# Patient Record
Sex: Male | Born: 1985 | Race: Black or African American | Hispanic: No | Marital: Single | Smoking: Former smoker
Health system: Southern US, Community
[De-identification: ages and names within clinical notes are randomized; demographics above are authoritative.]

## PROBLEM LIST (undated history)

## (undated) DIAGNOSIS — T7840XA Allergy, unspecified, initial encounter: Secondary | ICD-10-CM

## (undated) HISTORY — DX: Allergy, unspecified, initial encounter: T78.40XA

## (undated) HISTORY — PX: OTHER SURGICAL HISTORY: SHX169

---

## 2013-08-22 ENCOUNTER — Ambulatory Visit (INDEPENDENT_AMBULATORY_CARE_PROVIDER_SITE_OTHER): Payer: 59 | Admitting: Family Medicine

## 2013-08-22 VITALS — BP 116/74 | HR 71 | Temp 98.8°F | Resp 16 | Ht 75.0 in | Wt 177.2 lb

## 2013-08-22 DIAGNOSIS — R21 Rash and other nonspecific skin eruption: Secondary | ICD-10-CM

## 2013-08-22 DIAGNOSIS — J029 Acute pharyngitis, unspecified: Secondary | ICD-10-CM

## 2013-08-22 DIAGNOSIS — R059 Cough, unspecified: Secondary | ICD-10-CM

## 2013-08-22 DIAGNOSIS — R05 Cough: Secondary | ICD-10-CM

## 2013-08-22 DIAGNOSIS — J0110 Acute frontal sinusitis, unspecified: Secondary | ICD-10-CM

## 2013-08-22 MED ORDER — AMOXICILLIN-POT CLAVULANATE 875-125 MG PO TABS: 1.0000 | ORAL_TABLET | Freq: Two times a day (BID) | ORAL | Status: DC

## 2013-08-22 MED ORDER — BENZONATATE 100 MG PO CAPS: 200.0000 mg | ORAL_CAPSULE | Freq: Two times a day (BID) | ORAL | Status: DC | PRN

## 2013-08-22 MED ORDER — HYDROCODONE-HOMATROPINE 5-1.5 MG/5ML PO SYRP: 5.0000 mL | ORAL_SOLUTION | Freq: Every evening | ORAL | Status: DC | PRN

## 2013-08-22 NOTE — Progress Notes (Signed)
      Chief Complaint:  Chief Complaint  Patient presents with  . Sore Throat    x 2 days  . URI    x 2 days with fever and chills    HPI: Anthony Silva is a 28 y.o. male who is here for hot and cold chills since Saturday., associated with Sinus HA, has taken ibuprofen.  He has allergies. He also has boils on his let arm and  also left leg which were much bigger and painful and were draining .  He has been putting warm compresses on it. Denies any DM, HIV.   Past Medical History  Diagnosis Date  . Allergy    History reviewed. No pertinent past surgical history. History   Social History  . Marital Status: Single    Spouse Name: N/A    Number of Children: N/A  . Years of Education: N/A   Social History Main Topics  . Smoking status: Current Every Day Smoker  . Smokeless tobacco: None  . Alcohol Use: Yes  . Drug Use: Yes    Special: Marijuana  . Sexual Activity: None   Other Topics Concern  . None   Social History Narrative  . None   Family History  Problem Relation Age of Onset  . Diabetes Father   . Diabetes Sister    No Known Allergies Prior to Admission medications   Not on File     ROS: The patient denies fevers, chills, night sweats, unintentional weight loss, chest pain, palpitations, wheezing, dyspnea on exertion, nausea, vomiting, abdominal pain, dysuria, hematuria, melena, numbness, weakness, or tingling.   All other systems have been reviewed and were otherwise negative with the exception of those mentioned in the HPI and as above.    PHYSICAL EXAM: Filed Vitals:   08/22/13 1539  BP: 116/74  Pulse: 71  Temp: 98.8 F (37.1 C)  Resp: 16   Filed Vitals:   08/22/13 1539  Height: 6\' 3"  (1.905 m)  Weight: 177 lb 3.2 oz (80.377 kg)   Body mass index is 22.15 kg/(m^2).  General: Alert, no acute distress HEENT:  Normocephalic, atraumatic, oropharynx patent. EOMI, PERRLA, tm normal, + erythem throat, no exudates, + sinus  tenderness Cardiovascular:  Regular rate and rhythm, no rubs murmurs or gallops.  Radial pulse intact. No pedal edema.  Respiratory: Clear to auscultation bilaterally.  No wheezes, rales, or rhonchi.  No cyanosis, no use of accessory musculature GI: No organomegaly, abdomen is soft and non-tender, positive bowel sounds.  No masses. Skin: + abscess, no warmth or erythema, nonfluctuant Neurologic: Facial musculature symmetric. Psychiatric: Patient is appropriate throughout our interaction. Lymphatic: No cervical lymphadenopathy Musculoskeletal: Gait intact.   LABS: No results found for this or any previous visit.   EKG/XRAY:   [facility]   ASSESSMENT/PLAN: Encounter Diagnoses  Name Primary?  . Acute frontal sinusitis, recurrence not specified Yes  . Cough   . Acute pharyngitis, unspecified pharyngitis type   . Rash and nonspecific skin eruption    C/w warm compresses for abscesses, there is no fluctuance Rx augmentin, hycodan, tessalon perles F/u prn  Gross sideeffects, risk and benefits, and alternatives of medications d/w patient. Patient is aware that all medications have potential sideeffects and we are unable to predict every sideeffect or drug-drug interaction that may occur.  ,  PHUONG, DO 08/25/2013 5:56 AM

## 2015-03-06 ENCOUNTER — Ambulatory Visit (INDEPENDENT_AMBULATORY_CARE_PROVIDER_SITE_OTHER): Payer: BLUE CROSS/BLUE SHIELD | Admitting: Physician Assistant

## 2015-03-06 DIAGNOSIS — A084 Viral intestinal infection, unspecified: Secondary | ICD-10-CM | POA: Diagnosis not present

## 2015-03-06 NOTE — Progress Notes (Signed)
[facility] [address] [place] [address] [phone]  Date:  03/06/2015   Name:  Anthony Silva   DOB:  18-Jan-1985   MRN:  [id number]  PCP:  No primary care provider on file.   Chief Complaint  Patient presents with  . Chills    x 2 days  . Fever  . Nausea    History of Present Illness:  Anthony Silva is a 30 y.o. male patient who presents to [facility] for cc of fever chills, nausea and diarrhea.    Fever and chills that started 3 days ago--body aches in side and back.  He was having nausea and fatigue.  He was taking amoxicillin three times per day.  He has some coughing and gagging.  NO sore throat dor congestion.  He has been hydrating well.  Diarrhea 5-6 times per day, twice yesterday, and none today.  No frequency, hematuria, or dysuria.    There are no active problems to display for this patient.   Past Medical History  Diagnosis Date  . Allergy     Past Surgical History  Procedure Laterality Date  . Tooth removal      Social History  Substance Use Topics  . Smoking status: Current Every Day Smoker  . Smokeless tobacco: None  . Alcohol Use: Yes    Family History  Problem Relation Age of Onset  . Diabetes Father   . Diabetes Sister     No Known Allergies  Medication list has been reviewed and updated.  No current outpatient prescriptions on file prior to visit.   No current facility-administered medications on file prior to visit.    ROS ROS otherwise unremarkable unless listed above.   Physical Examination: There were no vitals taken for this visit. Ideal Body Weight:    Physical Exam  Constitutional: He is oriented to person, place, and time. He appears well-developed and well-nourished. No distress.  HENT:  Head: Normocephalic and atraumatic.  Right Ear: Tympanic membrane, external ear and ear canal normal.  Left Ear: Tympanic membrane, external ear and ear canal normal.  Nose: Mucosal edema and rhinorrhea  present. Right sinus exhibits no maxillary sinus tenderness and no frontal sinus tenderness. Left sinus exhibits no maxillary sinus tenderness and no frontal sinus tenderness.  Mouth/Throat: No uvula swelling. No oropharyngeal exudate, posterior oropharyngeal edema or posterior oropharyngeal erythema.  Eyes: Conjunctivae, EOM and lids are normal. Pupils are equal, round, and reactive to light. Right eye exhibits normal extraocular motion. Left eye exhibits normal extraocular motion.  Neck: Trachea normal and full passive range of motion without pain. No edema and no erythema present.  Cardiovascular: Normal rate.   Pulmonary/Chest: Effort normal. No respiratory distress. He has no decreased breath sounds. He has no wheezes. He has no rhonchi.  Abdominal: Soft. Normal appearance. Bowel sounds are increased. There is no tenderness.  Neurological: He is alert and oriented to person, place, and time.  Skin: Skin is warm and dry. He is not diaphoretic.  Psychiatric: He has a normal mood and affect. His behavior is normal.      Assessment and Plan: Anthony Silva is a 30 y.o. male who is here today for abdominal pain and diarrhea. This appears to be improving.  And likely viral (gastroenteritis, influenza).  Given anti-diarrhea diet.  Advised heavy hydration at this time.  He does not want further blood work.   Viral gastroenteritis     Trena Platt, PA-C [facility] 03/06/2015 1:30 PM

## 2015-03-06 NOTE — Patient Instructions (Addendum)
I would like you to hydrate well with water, and also mix a little gatorade and water to build your electrolytes.  64 oz of water if not more.   Please follow the diarrhea-friendly diet for the next 4-5 days.    Viral Gastroenteritis Viral gastroenteritis is also known as stomach flu. This condition affects the stomach and intestinal tract. It can cause sudden diarrhea and vomiting. The illness typically lasts 3 to 8 days. Most people develop an immune response that eventually gets rid of the virus. While this natural response develops, the virus can make you quite ill. CAUSES  Many different viruses can cause gastroenteritis, such as rotavirus or noroviruses. You can catch one of these viruses by consuming contaminated food or water. You may also catch a virus by sharing utensils or other personal items with an infected person or by touching a contaminated surface. SYMPTOMS  The most common symptoms are diarrhea and vomiting. These problems can cause a severe loss of body fluids (dehydration) and a body salt (electrolyte) imbalance. Other symptoms may include:  Fever.  Headache.  Fatigue.  Abdominal pain. DIAGNOSIS  Your caregiver can usually diagnose viral gastroenteritis based on your symptoms and a physical exam. A stool sample may also be taken to test for the presence of viruses or other infections. TREATMENT  This illness typically goes away on its own. Treatments are aimed at rehydration. The most serious cases of viral gastroenteritis involve vomiting so severely that you are not able to keep fluids down. In these cases, fluids must be given through an intravenous line (IV). HOME CARE INSTRUCTIONS   Drink enough fluids to keep your urine clear or pale yellow. Drink small amounts of fluids frequently and increase the amounts as tolerated.  Ask your caregiver for specific rehydration instructions.  Avoid:  Foods high in sugar.  Alcohol.  Carbonated  drinks.  Tobacco.  Juice.  Caffeine drinks.  Extremely hot or cold fluids.  Fatty, greasy foods.  Too much intake of anything at one time.  Dairy products until 24 to 48 hours after diarrhea stops.  You may consume probiotics. Probiotics are active cultures of beneficial bacteria. They may lessen the amount and number of diarrheal stools in adults. Probiotics can be found in yogurt with active cultures and in supplements.  Wash your hands well to avoid spreading the virus.  Only take over-the-counter or prescription medicines for pain, discomfort, or fever as directed by your caregiver. Do not give aspirin to children. Antidiarrheal medicines are not recommended.  Ask your caregiver if you should continue to take your regular prescribed and over-the-counter medicines.  Keep all follow-up appointments as directed by your caregiver. SEEK IMMEDIATE MEDICAL CARE IF:   You are unable to keep fluids down.  You do not urinate at least once every 6 to 8 hours.  You develop shortness of breath.  You notice blood in your stool or vomit. This may look like coffee grounds.  You have abdominal pain that increases or is concentrated in one small area (localized).  You have persistent vomiting or diarrhea.  You have a fever.  The patient is a child younger than 3 months, and he or she has a fever.  The patient is a child older than 3 months, and he or she has a fever and persistent symptoms.  The patient is a child older than 3 months, and he or she has a fever and symptoms suddenly get worse.  The patient is a baby, and he  or she has no tears when crying. MAKE SURE YOU:   Understand these instructions.  Will watch your condition.  Will get help right away if you are not doing well or get worse.   This information is not intended to replace advice given to you by your health care provider. Make sure you discuss any questions you have with your health care provider.    Document Released: 12/30/2004 Document Revised: 03/24/2011 Document Reviewed: 10/16/2010 Elsevier Interactive Patient Education 2016 [employer].  Food Choices to Help Relieve Diarrhea, Adult When you have diarrhea, the foods you eat and your eating habits are very important. Choosing the right foods and drinks can help relieve diarrhea. Also, because diarrhea can last up to 7 days, you need to replace lost fluids and electrolytes (such as sodium, potassium, and chloride) in order to help prevent dehydration.  WHAT GENERAL GUIDELINES DO I NEED TO FOLLOW?  Slowly drink 1 cup (8 oz) of fluid for each episode of diarrhea. If you are getting enough fluid, your urine will be clear or pale yellow.  Eat starchy foods. Some good choices include white rice, white toast, pasta, low-fiber cereal, baked potatoes (without the skin), saltine crackers, and bagels.  Avoid large servings of any cooked vegetables.  Limit fruit to two servings per day. A serving is  cup or 1 small piece.  Choose foods with less than 2 g of fiber per serving.  Limit fats to less than 8 tsp (38 g) per day.  Avoid fried foods.  Eat foods that have probiotics in them. Probiotics can be found in certain dairy products.  Avoid foods and beverages that may increase the speed at which food moves through the stomach and intestines (gastrointestinal tract). Things to avoid include:  High-fiber foods, such as dried fruit, raw fruits and vegetables, nuts, seeds, and whole grain foods.  Spicy foods and high-fat foods.  Foods and beverages sweetened with high-fructose corn syrup, honey, or sugar alcohols such as xylitol, sorbitol, and mannitol. WHAT FOODS ARE RECOMMENDED? Grains White rice. White, [place], or pita breads (fresh or toasted), including plain rolls, buns, or bagels. White pasta. Saltine, soda, or graham crackers. Pretzels. Low-fiber cereal. Cooked cereals made with water (such as cornmeal, farina, or cream cereals).  Plain muffins. Matzo. Melba toast. Zwieback.  Vegetables Potatoes (without the skin). Strained tomato and vegetable juices. Most well-cooked and canned vegetables without seeds. Tender lettuce. Fruits Cooked or canned applesauce, apricots, cherries, fruit cocktail, grapefruit, peaches, pears, or plums. Fresh bananas, apples without skin, cherries, grapes, cantaloupe, grapefruit, peaches, oranges, or plums.  Meat and Other Protein Products Baked or boiled chicken. Eggs. Tofu. Fish. Seafood. Smooth peanut butter. Ground or well-cooked tender beef, ham, veal, lamb, pork, or poultry.  Dairy Plain yogurt, kefir, and unsweetened liquid yogurt. Lactose-free milk, buttermilk, or soy milk. Plain hard cheese. Beverages Sport drinks. Clear broths. Diluted fruit juices (except prune). Regular, caffeine-free sodas such as ginger ale. Water. Decaffeinated teas. Oral rehydration solutions. Sugar-free beverages not sweetened with sugar alcohols. Other Bouillon, broth, or soups made from recommended foods.  The items listed above may not be a complete list of recommended foods or beverages. Contact your dietitian for more options. WHAT FOODS ARE NOT RECOMMENDED? Grains Whole grain, whole wheat, bran, or rye breads, rolls, pastas, crackers, and cereals. Wild or brown rice. Cereals that contain more than 2 g of fiber per serving. Corn tortillas or taco shells. Cooked or dry oatmeal. Granola. Popcorn. Vegetables Raw vegetables. Cabbage, broccoli, Brussels sprouts,  artichokes, baked beans, beet greens, corn, kale, legumes, peas, sweet potatoes, and yams. Potato skins. Cooked spinach and cabbage. Fruits Dried fruit, including raisins and dates. Raw fruits. Stewed or dried prunes. Fresh apples with skin, apricots, mangoes, pears, raspberries, and strawberries.  Meat and Other Protein Products Chunky peanut butter. Nuts and seeds. Beans and lentils. Tomasa Blase.  Dairy High-fat cheeses. Milk, chocolate milk, and beverages  made with milk, such as milk shakes. Cream. Ice cream. Sweets and Desserts Sweet rolls, doughnuts, and sweet breads. Pancakes and waffles. Fats and Oils Butter. Cream sauces. Margarine. Salad oils. Plain salad dressings. Olives. Avocados.  Beverages Caffeinated beverages (such as coffee, tea, soda, or energy drinks). Alcoholic beverages. Fruit juices with pulp. Prune juice. Soft drinks sweetened with high-fructose corn syrup or sugar alcohols. Other Coconut. Hot sauce. Chili powder. Mayonnaise. Gravy. Cream-based or milk-based soups.  The items listed above may not be a complete list of foods and beverages to avoid. Contact your dietitian for more information. WHAT SHOULD I DO IF I BECOME DEHYDRATED? Diarrhea can sometimes lead to dehydration. Signs of dehydration include dark urine and dry mouth and skin. If you think you are dehydrated, you should rehydrate with an oral rehydration solution. These solutions can be purchased at pharmacies, retail stores, or online.  Drink -1 cup (120-240 mL) of oral rehydration solution each time you have an episode of diarrhea. If drinking this amount makes your diarrhea worse, try drinking smaller amounts more often. For example, drink 1-3 tsp (5-15 mL) every 5-10 minutes.  A general rule for staying hydrated is to drink 1-2 L of fluid per day. Talk to your health care provider about the specific amount you should be drinking each day. Drink enough fluids to keep your urine clear or pale yellow.   This information is not intended to replace advice given to you by your health care provider. Make sure you discuss any questions you have with your health care provider.   Document Released: 03/22/2003 Document Revised: 01/20/2014 Document Reviewed: 11/22/2012 Elsevier Interactive Patient Education [employer]

## 2016-04-14 ENCOUNTER — Ambulatory Visit (INDEPENDENT_AMBULATORY_CARE_PROVIDER_SITE_OTHER): Payer: BLUE CROSS/BLUE SHIELD | Admitting: Urgent Care

## 2016-04-14 VITALS — BP 122/73 | HR 105 | Resp 14 | Ht 75.5 in | Wt 160.0 lb

## 2016-04-14 DIAGNOSIS — F1990 Other psychoactive substance use, unspecified, uncomplicated: Secondary | ICD-10-CM | POA: Diagnosis not present

## 2016-04-14 DIAGNOSIS — F411 Generalized anxiety disorder: Secondary | ICD-10-CM | POA: Diagnosis not present

## 2016-04-14 DIAGNOSIS — L299 Pruritus, unspecified: Secondary | ICD-10-CM

## 2016-04-14 MED ORDER — IVERMECTIN 3 MG PO TABS: 200.0000 ug/kg | ORAL_TABLET | ORAL | 0 refills | Status: DC

## 2016-04-14 MED ORDER — HYDROXYZINE HCL 50 MG PO TABS: 50.0000 mg | ORAL_TABLET | Freq: Every evening | ORAL | 0 refills | Status: DC | PRN

## 2016-04-14 NOTE — Patient Instructions (Addendum)
Ivermectin Take 5 tablets today. Repeat this dose in 10 days.  Anxiety Take Vistaril for your anxiety associated with the rash. Do not use marijuana or alcohol.     IF you received an x-ray today, you will receive an invoice from [facility]. Please contact [facility] at [phone] with questions or concerns regarding your invoice.   IF you received labwork today, you will receive an invoice from [facility]. Please contact [facility] at [phone] with questions or concerns regarding your invoice.   Our billing staff will not be able to assist you with questions regarding bills from these companies.  You will be contacted with the lab results as soon as they are available. The fastest way to get your results is to activate your My Chart account. Instructions are located on the last page of this paperwork. If you have not heard from [place] regarding the results in 2 weeks, please contact this office.

## 2016-04-14 NOTE — Progress Notes (Signed)
  MRN: [id number] DOB: 12/03/1985  Subjective:   Anthony Silva is a 31 y.o. male presenting for chief complaint of Personal Problem (? scabies. has used prometherine cream x2 doses states still has itching)  Reports 3 week history of worsening itching of his arms, now spreading to the rest of his body. He first noticed a bug bite over his torso and body. States that he visually saw them burrow into his arms. Has even tried to pick them out using tacs. Has been seen at an urgent care, started taking a permethrin. Used his brother's left over supply of amoxicillin. Has been smoking weed daily due anxiety from his symptoms, itching. Smokes 1 ppd. Has been using cocaine as well. Denis using crystal meth, narcotics, heroin. Denies fever, chest pain, confusion, rash.  Koen has a current medication list which includes the following prescription(s): amoxicillin and permethrin. Also has No Known Allergies. Laquentin  has a past medical history of Allergy. Also  has a past surgical history that includes tooth removal.   Objective:   Vitals: BP 122/73   Pulse (!) 105   Resp 14   Ht 6' 3.5" (1.918 m)   Wt 160 lb (72.6 kg)   SpO2 99%   BMI 19.73 kg/m   Physical Exam  Constitutional: He is oriented to person, place, and time. He appears well-developed and well-nourished.  HENT:  Mouth/Throat: Oropharynx is clear and moist.  Eyes: No scleral icterus.  Conjunctiva injected bilaterally.  Neck: Normal range of motion. Neck supple.  Cardiovascular: Exam reveals no gallop and no friction rub.   No murmur heard. Rate was 88 on recheck by PA-Henlee Donovan.  Pulmonary/Chest: No respiratory distress. He has no wheezes. He has no rales.  Lymphadenopathy:    He has no cervical adenopathy.  Neurological: He is alert and oriented to person, place, and time.  Skin: Skin is warm and dry. No rash noted.  Excoriation on left distal bicep.  Psychiatric:  Patient is very anxious, tearful.   Assessment and Plan :    1. Itching 2. Anxiety state 3. Drug use - Patient is very worried about scabies. I will have patient use ivermectin to address this given that it is low risk. Will start patient on Vistaril. Counseled on stopping his drug use, alcohol use. He contracted for safety. Recheck in 1 week to discuss anti-anxiety and depression medications, labs pending.  Wallis Bamberg, PA-C [facility] at [facility] [phone] 04/14/2016  10:58 AM

## 2016-04-15 LAB — CBC WITH DIFFERENTIAL/PLATELET
Basophils Absolute: 0 10*3/uL (ref 0.0–0.2)
Basos: 0 %
EOS (ABSOLUTE): 0.1 10*3/uL (ref 0.0–0.4)
Eos: 2 %
Hematocrit: 45.5 % (ref 37.5–51.0)
Hemoglobin: 15.5 g/dL (ref 13.0–17.7)
Immature Grans (Abs): 0 10*3/uL (ref 0.0–0.1)
Immature Granulocytes: 0 %
Lymphocytes Absolute: 1.7 10*3/uL (ref 0.7–3.1)
Lymphs: 29 %
MCH: 27.8 pg (ref 26.6–33.0)
MCHC: 34.1 g/dL (ref 31.5–35.7)
MCV: 82 fL (ref 79–97)
MONOCYTES: 10 %
Monocytes Absolute: 0.6 10*3/uL (ref 0.1–0.9)
Neutrophils Absolute: 3.5 10*3/uL (ref 1.4–7.0)
Neutrophils: 59 %
Platelets: 284 10*3/uL (ref 150–379)
RBC: 5.58 x10E6/uL (ref 4.14–5.80)
RDW: 13.7 % (ref 12.3–15.4)
WBC: 5.9 10*3/uL (ref 3.4–10.8)

## 2016-04-15 LAB — COMPREHENSIVE METABOLIC PANEL
A/G RATIO: 1.6 (ref 1.2–2.2)
ALK PHOS: 55 IU/L (ref 39–117)
ALT: 17 IU/L (ref 0–44)
AST: 21 IU/L (ref 0–40)
Albumin: 4.8 g/dL (ref 3.5–5.5)
BUN/Creatinine Ratio: 12 (ref 9–20)
BUN: 14 mg/dL (ref 6–20)
Bilirubin Total: 0.7 mg/dL (ref 0.0–1.2)
CO2: 20 mmol/L (ref 18–29)
Calcium: 9.8 mg/dL (ref 8.7–10.2)
Chloride: 95 mmol/L — ABNORMAL LOW (ref 96–106)
Creatinine, Ser: 1.15 mg/dL (ref 0.76–1.27)
GFR calc Af Amer: 100 mL/min/{1.73_m2} (ref >59)
GFR calc non Af Amer: 86 mL/min/{1.73_m2} (ref >59)
GLOBULIN, TOTAL: 3 g/dL (ref 1.5–4.5)
Glucose: 84 mg/dL (ref 65–99)
POTASSIUM: 4.2 mmol/L (ref 3.5–5.2)
SODIUM: 135 mmol/L (ref 134–144)
Total Protein: 7.8 g/dL (ref 6.0–8.5)

## 2016-04-15 LAB — SEDIMENTATION RATE: Sed Rate: 7 mm/hr (ref 0–15)

## 2016-04-17 ENCOUNTER — Telehealth: Payer: Self-pay | Admitting: Urgent Care

## 2016-04-17 NOTE — Telephone Encounter (Signed)
Note for work is okay - I let patient know that this does not qualify for disability or FMLA. He verbalized understanding. But give him the note if he wants it.

## 2016-04-17 NOTE — Telephone Encounter (Signed)
Ok to write? 

## 2016-04-17 NOTE — Telephone Encounter (Signed)
Pt states that he still not feeling well and will like to know if you can write him another note to return to work on Monday. He also states that he is very sore.  Please advise,  Pt contact number [phone]

## 2016-04-17 NOTE — Telephone Encounter (Signed)
Done and picked up. 

## 2016-04-29 ENCOUNTER — Ambulatory Visit (INDEPENDENT_AMBULATORY_CARE_PROVIDER_SITE_OTHER): Payer: BLUE CROSS/BLUE SHIELD | Admitting: Physician Assistant

## 2016-04-29 VITALS — BP 114/66 | HR 87 | Temp 98.5°F | Resp 18 | Ht 75.5 in | Wt 166.8 lb

## 2016-04-29 DIAGNOSIS — J309 Allergic rhinitis, unspecified: Secondary | ICD-10-CM

## 2016-04-29 DIAGNOSIS — H1013 Acute atopic conjunctivitis, bilateral: Secondary | ICD-10-CM | POA: Diagnosis not present

## 2016-04-29 MED ORDER — OLOPATADINE HCL 0.2 % OP SOLN: 1.0000 [drp] | Freq: Every day | OPHTHALMIC | 1 refills | Status: DC

## 2016-04-29 MED ORDER — FLUTICASONE PROPIONATE 50 MCG/ACT NA SUSP: 2.0000 | Freq: Every day | NASAL | 6 refills | Status: DC

## 2016-04-29 MED ORDER — CETIRIZINE HCL 10 MG PO CAPS: 1.0000 | ORAL_CAPSULE | Freq: Every day | ORAL | 1 refills | Status: DC

## 2016-04-29 NOTE — Progress Notes (Signed)
MRN: [id number] DOB: [birth year]  Subjective:   Anthony Silva is a 31 y.o. male presenting for chief complaint of Allergies (x weeks  runny nose, watery eyes, sore throat ) .  Reports 3 week history of worsening rhinorrhea, itchy watery eyes, red eyes and sore throat. Has tried zyrtec, claritin, and benedryl, each for about a week at a time but they have not given him full relief.  Denies fever, rhinorrhea, ear pain, chest pain, myalgia and cough, chills, fatigue, nausea, vomiting, abdominal pain and diarrhea.  Has extensive history of seasonal allergies, but takes medication sporadically. He has been evaluated by an allergist in middle school but eventually stopped going. No history of asthma.  Current smoker, smokes 3 cigarettes a day. Denies any other aggravating or relieving factors, no other questions or concerns.    Truxton has a current medication list which includes the following prescription(s): hydroxyzine, amoxicillin, and ivermectin. Also has No Known Allergies.  Draycen  has a past medical history of Allergy. Also  has a past surgical history that includes tooth removal.   Objective:   Vitals: BP 114/66   Pulse 87   Temp 98.5 F (36.9 C) (Oral)   Resp 18   Ht 6' 3.5" (1.918 m)   Wt 166 lb 12.8 oz (75.7 kg)   SpO2 97%   BMI 20.57 kg/m   Physical Exam  Constitutional: He is oriented to person, place, and time. He appears well-developed and well-nourished.  HENT:  Head: Normocephalic and atraumatic.  Right Ear: External ear and ear canal normal. Tympanic membrane is retracted.  Left Ear: External ear and ear canal normal. Tympanic membrane is retracted.  Nose: Mucosal edema (moderate, bilaterally ) and rhinorrhea present. Right sinus exhibits no maxillary sinus tenderness and no frontal sinus tenderness. Left sinus exhibits no maxillary sinus tenderness and no frontal sinus tenderness.  Mouth/Throat: Uvula is midline and mucous membranes are normal. Posterior  oropharyngeal erythema present. No tonsillar exudate.  Eyes: Right eye exhibits discharge (watery). Left eye exhibits discharge (watery). Right conjunctiva is injected. Left conjunctiva is injected.  Neck: Normal range of motion.  Cardiovascular: Normal rate, regular rhythm, normal heart sounds and normal pulses.   Pulmonary/Chest: Effort normal and breath sounds normal.  Lymphadenopathy:       Head (right side): No submental, no submandibular, no tonsillar, no preauricular, no posterior auricular and no occipital adenopathy present.       Head (left side): No submental, no submandibular, no tonsillar, no preauricular, no posterior auricular and no occipital adenopathy present.       Right: No supraclavicular adenopathy present.       Left: No supraclavicular adenopathy present.  Neurological: He is alert and oriented to person, place, and time.  Skin: Skin is warm and dry.  Psychiatric: He has a normal mood and affect.  Vitals reviewed.  No results found for this or any previous visit (from the past 24 hour(s)).  Assessment and Plan :  1. Allergic rhinitis, unspecified seasonality, unspecified trigger -Pt instructed to take medications daily for the next 3 weeks instead of sporadically. If no improvement in 3 weeks, contact our office as he may warrant from referral to allergy specialist at that time.  - fluticasone (FLONASE) 50 MCG/ACT nasal spray; Place 2 sprays into both nostrils daily.  Dispense: 16 g; Refill: 6 - Cetirizine HCl 10 MG CAPS; Take 1 capsule (10 mg total) by mouth daily.  Dispense: 90 capsule; Refill: 1  2. Allergic conjunctivitis of  both eyes - Olopatadine HCl 0.2 % SOLN; Apply 1 drop to eye daily.  Dispense: 2.5 mL; Refill: 1  Benjiman Core, PA-C  [facility] 04/29/2016 10:10 AM

## 2016-04-29 NOTE — Patient Instructions (Addendum)
I would like you start taking zyrtec, flonase, and antihistamine eye drops daily at least until your allergy season is over. If you are not having any full improvement in 3 weeks, please contact our office and I will place a referral to an allergy specialist. Thank you for letting me participate in your health and well being.  Allergic Rhinitis Allergic rhinitis is when the mucous membranes in the nose respond to allergens. Allergens are particles in the air that cause your body to have an allergic reaction. This causes you to release allergic antibodies. Through a chain of events, these eventually cause you to release histamine into the blood stream. Although meant to protect the body, it is this release of histamine that causes your discomfort, such as frequent sneezing, congestion, and an itchy, runny nose. What are the causes? Seasonal allergic rhinitis (hay fever) is caused by pollen allergens that may come from grasses, trees, and weeds. Year-round allergic rhinitis (perennial allergic rhinitis) is caused by allergens such as house dust mites, pet dander, and mold spores. What are the signs or symptoms?  Nasal stuffiness (congestion).  Itchy, runny nose with sneezing and tearing of the eyes. How is this diagnosed? Your health care provider can help you determine the allergen or allergens that trigger your symptoms. If you and your health care provider are unable to determine the allergen, skin or blood testing may be used. Your health care provider will diagnose your condition after taking your health history and performing a physical exam. Your health care provider may assess you for other related conditions, such as asthma, pink eye, or an ear infection. How is this treated? Allergic rhinitis does not have a cure, but it can be controlled by:  Medicines that block allergy symptoms. These may include allergy shots, nasal sprays, and oral antihistamines.  Avoiding the allergen. Hay fever may  often be treated with antihistamines in pill or nasal spray forms. Antihistamines block the effects of histamine. There are over-the-counter medicines that may help with nasal congestion and swelling around the eyes. Check with your health care provider before taking or giving this medicine. If avoiding the allergen or the medicine prescribed do not work, there are many new medicines your health care provider can prescribe. Stronger medicine may be used if initial measures are ineffective. Desensitizing injections can be used if medicine and avoidance does not work. Desensitization is when a patient is given ongoing shots until the body becomes less sensitive to the allergen. Make sure you follow up with your health care provider if problems continue. Follow these instructions at home: It is not possible to completely avoid allergens, but you can reduce your symptoms by taking steps to limit your exposure to them. It helps to know exactly what you are allergic to so that you can avoid your specific triggers. Contact a health care provider if:  You have a fever.  You develop a cough that does not stop easily (persistent).  You have shortness of breath.  You start wheezing.  Symptoms interfere with normal daily activities. This information is not intended to replace advice given to you by your health care provider. Make sure you discuss any questions you have with your health care provider. Document Released: 09/24/2000 Document Revised: 08/31/2015 Document Reviewed: 09/06/2012 Elsevier Interactive Patient Education  2017 [employer].    IF you received an x-ray today, you will receive an invoice from [facility]. Please contact [facility] at [phone] with questions or concerns regarding your invoice.  IF you received labwork today, you will receive an invoice from [place]. Please contact [facility] at [phone] with questions or concerns regarding your invoice.    Our billing staff will not be able to assist you with questions regarding bills from these companies.  You will be contacted with the lab results as soon as they are available. The fastest way to get your results is to activate your My Chart account. Instructions are located on the last page of this paperwork. If you have not heard from [place] regarding the results in 2 weeks, please contact this office.

## 2016-05-11 ENCOUNTER — Other Ambulatory Visit: Payer: Self-pay | Admitting: Urgent Care

## 2016-05-11 DIAGNOSIS — F411 Generalized anxiety disorder: Secondary | ICD-10-CM

## 2016-06-05 ENCOUNTER — Ambulatory Visit (INDEPENDENT_AMBULATORY_CARE_PROVIDER_SITE_OTHER): Payer: BLUE CROSS/BLUE SHIELD | Admitting: Internal Medicine

## 2016-06-05 ENCOUNTER — Encounter: Payer: Self-pay | Admitting: Internal Medicine

## 2016-06-05 VITALS — BP 128/80 | HR 81 | Temp 98.5°F | Resp 16 | Ht 74.75 in | Wt 159.4 lb

## 2016-06-05 DIAGNOSIS — F203 Undifferentiated schizophrenia: Secondary | ICD-10-CM | POA: Diagnosis not present

## 2016-06-05 DIAGNOSIS — Z23 Encounter for immunization: Secondary | ICD-10-CM

## 2016-06-05 DIAGNOSIS — F19159 Other psychoactive substance abuse with psychoactive substance-induced psychotic disorder, unspecified: Secondary | ICD-10-CM | POA: Diagnosis not present

## 2016-06-05 DIAGNOSIS — F14151 Cocaine abuse with cocaine-induced psychotic disorder with hallucinations: Secondary | ICD-10-CM | POA: Insufficient documentation

## 2016-06-05 MED ORDER — CARIPRAZINE HCL 1.5 & 3 MG PO CPPK: 1.0000 | ORAL_CAPSULE | Freq: Every day | ORAL | 0 refills | Status: DC

## 2016-06-05 NOTE — Patient Instructions (Signed)
Schizophrenia  Schizophrenia is a mental illness. It may cause disturbed or disorganized thinking, speech, or behavior. People with schizophrenia have problems functioning in one or more areas of life. People with schizophrenia are at increased risk for suicide, certain long-term (chronic) physical illnesses, and unhealthy behaviors, such as smoking and drug use.  People who have family members with schizophrenia are at higher risk of developing the illness. Schizophrenia affects men and women equally, but it usually appears at an earlier age (teenage or early adult years) in men.  What are the causes?  The cause of this condition is not known.  What increases the risk?  The following factors may make you more likely to develop this condition:   Having a family member who has schizophrenia. Some gene combinations may increase the risk, but there is no single gene that causes schizophrenia.   Impaired brain or neurotransmitter development or chemistry. Neurotransmitters are chemicals in the brain.    What are the signs or symptoms?  The earliest symptoms are often subtle and may go unnoticed until the illness becomes more severe (first-break psychosis). Symptoms of schizophrenia may be ongoing (continuous) or may come and go in severity. Episodes are often triggered by major life events, such as:   Family stress.   College.   Military service.   Marriage.   Pregnancy or childbirth.   Divorce.   Loss of a loved one.    Symptoms may include:   Seeing, hearing, or feeling things that do not exist (hallucinations).   Having false beliefs (delusions). Delusions often involve beliefs that you are being attacked, harassed, cheated, persecuted, or conspired against (persecutory delusions).   Speech that does not make sense to others or is hard to understand (incoherent).   Behavior that is odd, confused, unfocused, withdrawn, or disorganized.   Extremely overactive or underactive motor activity (catatonia).  Motor activity is any action that involves the muscles.   Bland or blunted emotions (flat affect).   Loss of will power (avolition).   Withdrawal from social contacts (social isolation).    Symptoms may affect the level of functioning in one or more major areas of life, such as work, school, relationships, or self-care.  How is this diagnosed?  Schizophrenia is diagnosed through an assessment by a mental health care provider.   Your mental health care provider may ask questions about:  ? Your thoughts, behavior, and mood.  ? Your ability to function in daily life.  ? Your medical history.  ? Any use of alcohol or drugs, including prescription medicines.   You may have blood tests and imaging exams.    How is this treated?  Schizophrenia is a chronic illness that is best controlled with continuous treatment rather than treatment only when symptoms occur. The following treatments are used to manage schizophrenia:   Medicine. This is the most effective and important form of treatment for schizophrenia. Antipsychotic medicines are usually prescribed to help manage schizophrenia. Other types of medicine may be added to relieve any symptoms that may occur despite the use of antipsychotic medicines.   Counseling or talk therapy. Individual, group, or family counseling may be helpful in providing education, support, and guidance. Many people also benefit from social skills and job skills (vocational) training.    A combination of medicine and counseling is best for managing the disorder over time. A procedure in which electricity is applied to the brain through the scalp (electroconvulsive therapy) may be used to treat catatonic schizophrenia or   schizophrenia in people who cannot take medicine or do not respond to medicine and counseling.  Follow these instructions at home:   Keep stress under control. Stress may trigger psychosis and make symptoms worse.   Try to get as much sleep as you can.   Avoid alcohol and  drugs. They can affect how medicine works and make symptoms worse.   Surround yourself with people who care about you and can help you manage your condition.   Take over-the-counter and prescription medicines only as told by your health care provider.   Keep all follow-up visits as told by your health care provider and counselor. This is important.  Contact a health care provider if:   You have a bad response to changes in medicines or to your treatment plan.   You have trouble falling sleep.   You have a low mood that will not go away.   You are using:  ? Drugs.  ? Too much caffeine.  ? Tobacco products.  ? Alcohol.  Get help right away if:   You feel out of control.   You or others notice warning signs of suicide such as:  ? Increased use of drugs or alcohol  ? Expressing feelings of not having a purpose in life, being trapped, guilty, anxious and agitated, or hopeless.  ? Withdrawing from friends and family.  ? Showing uncontrolled anger, recklessness, and dramatic mood changes.  ? Talking about suicide, discussing or searching for methods.  If you ever feel like you may hurt yourself or others, or have thoughts about taking your own life, get help right away. You can go to your nearest emergency department or call:   Your local emergency services (911 in the [place]   A suicide crisis helpline, such as the National Suicide Prevention Lifeline at 1-800-273-8255. This is open 24 hours a day.    Summary   Schizophrenia is a mental illness that causes disturbed or disorganized thinking, speech, or behavior.   Symptoms of schizophrenia may be ongoing or may come and go. They are often triggered by major life events.   Keep stress under control. Stress may trigger psychosis and make symptoms worse.   Avoid alcohol and drugs. They can affect how medicine works and make symptoms worse.   Get help right away if you feel out of control.  This information is not intended to replace advice given to you by  your health care provider. Make sure you discuss any questions you have with your health care provider.  Document Released: 12/28/1999 Document Revised: 10/12/2015 Document Reviewed: 10/12/2015  Elsevier Interactive Patient Education  2017 [employer]

## 2016-06-05 NOTE — Progress Notes (Signed)
Subjective:  Patient ID: Anthony Silva, male    DOB: [birth year]  Age: 31 y.o. MRN: [id number]  CC: Delusional  NEW TO ME  HPI Anthony MerlCameron T Silva presents for concerns about thought disorder and hallucinations/delusions. He admits to intermittent abuse of marijuana, cocaine, and alcohol. For the last few months he's been seeing things that he has been told that other people aren't seeing. He describes looking out the window of his house and seeing a pack of foxes eat a dear. He tells me he points this out to other members of the house and they do not see the same thing. He also tells me that he's had images of animals elevating his mattress. He's never been treated for this before. He tells me he sleeps about 8 or 9 hours a night but does complain of feeling depressed and anxious. He said no suicidal or homicidal ideations. He denies auditory hallucinations of a command him to take an action or do anything dangerous. He denies any sprees of manic or hypomanic behavior  History Anthony Silva has a past medical history of Allergy.   He has a past surgical history that includes tooth removal.   His family history includes Diabetes in his father and sister.He reports that he has been smoking.  He has never used smokeless tobacco. He reports that he drinks alcohol. He reports that he uses drugs, including Marijuana and Cocaine.  Outpatient Medications Prior to Visit  Medication Sig Dispense Refill  . Cetirizine HCl 10 MG CAPS Take 1 capsule (10 mg total) by mouth daily. 90 capsule 1  . fluticasone (FLONASE) 50 MCG/ACT nasal spray Place 2 sprays into both nostrils daily. 16 g 6  . hydrOXYzine (ATARAX/VISTARIL) 50 MG tablet Take 1 tablet (50 mg total) by mouth at bedtime as needed. *office visit needed* 30 tablet 0  . Olopatadine HCl 0.2 % SOLN Apply 1 drop to eye daily. 2.5 mL 1   No facility-administered medications prior to visit.     ROS Review of Systems  Constitutional: Negative.  Negative  for activity change, appetite change, diaphoresis, fatigue and unexpected weight change.  HENT: Negative.   Eyes: Negative for visual disturbance.  Respiratory: Negative for cough, chest tightness, shortness of breath and wheezing.   Cardiovascular: Negative for chest pain, palpitations and leg swelling.  Gastrointestinal: Negative for abdominal pain, constipation, diarrhea, nausea and vomiting.  Endocrine: Negative.   Genitourinary: Negative.  Negative for difficulty urinating, frequency, penile swelling, scrotal swelling, testicular pain and urgency.  Musculoskeletal: Negative.  Negative for back pain and myalgias.  Allergic/Immunologic: Negative.   Neurological: Negative.  Negative for dizziness, weakness, light-headedness and headaches.  Hematological: Negative.  Negative for adenopathy.  Psychiatric/Behavioral: Positive for dysphoric mood and hallucinations. Negative for agitation, behavioral problems, confusion, decreased concentration, self-injury, sleep disturbance and suicidal ideas. The patient is nervous/anxious. The patient is not hyperactive.     Objective:  BP 128/80 (BP Location: Left Arm, Patient Position: Sitting, Cuff Size: Normal)   Pulse 81   Temp 98.5 F (36.9 C) (Oral)   Resp 16   Ht 6' 2.75" (1.899 m)   Wt 159 lb 7 oz (72.3 kg)   SpO2 98%   BMI 20.06 kg/m   Physical Exam  Constitutional: He is oriented to person, place, and time.  Non-toxic appearance. He does not have a sickly appearance. He does not appear ill. No distress.  HENT:  Mouth/Throat: No oropharyngeal exudate.  Eyes: Conjunctivae are normal. Right eye exhibits no discharge.  Left eye exhibits no discharge. No scleral icterus.  Neck: Normal range of motion. Neck supple. No JVD present. No tracheal deviation present. No thyromegaly present.  Cardiovascular: Normal rate, regular rhythm, normal heart sounds and intact distal pulses.  Exam reveals no gallop and no friction rub.   No murmur  heard. Pulmonary/Chest: Effort normal and breath sounds normal. No stridor. No respiratory distress. He has no wheezes. He has no rales. He exhibits no tenderness.  Abdominal: Soft. Bowel sounds are normal. He exhibits no distension and no mass. There is no tenderness. There is no rebound and no guarding.  Musculoskeletal: Normal range of motion. He exhibits no edema, tenderness or deformity.  Lymphadenopathy:    He has no cervical adenopathy.  Neurological: He is oriented to person, place, and time.  Skin: Skin is warm and dry. No rash noted. He is not diaphoretic. No erythema. No pallor.  Psychiatric: Judgment normal. His mood appears anxious. His affect is not angry, not blunt, not labile and not inappropriate. His speech is not delayed, not tangential and not slurred. He is not agitated, not aggressive, not hyperactive, not slowed, not withdrawn and not actively hallucinating. Thought content is delusional. Thought content is not paranoid. Cognition and memory are normal. Cognition and memory are not impaired. He does not express impulsivity. He does not exhibit a depressed mood. He expresses no homicidal and no suicidal ideation. He expresses no suicidal plans and no homicidal plans. He is attentive.  Vitals reviewed.   Lab Results  Component Value Date   WBC 5.9 04/14/2016   HCT 45.5 04/14/2016   PLT 284 04/14/2016   GLUCOSE 84 04/14/2016   ALT 17 04/14/2016   AST 21 04/14/2016   NA 135 04/14/2016   K 4.2 04/14/2016   CL 95 (L) 04/14/2016   CREATININE 1.15 04/14/2016   BUN 14 04/14/2016   CO2 20 04/14/2016    Assessment & Plan:   Jusitn was seen today for delusional.  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 Will start for Vraylar at the 1.5 mg dose and will slowly increase the dose over time  as tolerated and depending on his response to it. -     Cariprazine HCl (VRAYLAR) 1.5 & 3 MG CPPK; Take 1 tablet by mouth daily.  Polysubstance dependence including opioid type drug with complication, episodic abuse (HCC)- I encouraged him to abstain from the use of any mood and mind altering medications or substances. He agrees to try to achieve this. I also advised him that I think it would be good for him to consider entering to a drug rehabilitation program but he is not willing to commit to that at this time.  Need for Tdap vaccination -     Tdap vaccine greater than or equal to 7yo IM   I am having Mr. Musgrave start on Cariprazine HCl. I am also having him maintain his Olopatadine HCl, fluticasone, Cetirizine HCl, and hydrOXYzine.  Meds ordered this encounter  Medications  . Cariprazine HCl (VRAYLAR) 1.5 & 3 MG CPPK    Sig: Take 1 tablet by mouth daily.    Dispense:  28 each    Refill:  0     Follow-up: Return in about 3 weeks (around 06/26/2016).  Sanda Linger, MD

## 2016-06-17 ENCOUNTER — Ambulatory Visit: Payer: BLUE CROSS/BLUE SHIELD | Admitting: Physician Assistant

## 2016-07-20 ENCOUNTER — Emergency Department (HOSPITAL_COMMUNITY)
Admission: EM | Admit: 2016-07-20 | Discharge: 2016-07-20 | Disposition: A | Discharge status: Home / Self Care | Payer: BLUE CROSS/BLUE SHIELD | Attending: Emergency Medicine | Admitting: Emergency Medicine

## 2016-07-20 ENCOUNTER — Encounter (HOSPITAL_COMMUNITY): Payer: Self-pay

## 2016-07-20 DIAGNOSIS — M79604 Pain in right leg: Secondary | ICD-10-CM | POA: Insufficient documentation

## 2016-07-20 DIAGNOSIS — M542 Cervicalgia: Secondary | ICD-10-CM | POA: Insufficient documentation

## 2016-07-20 DIAGNOSIS — M79605 Pain in left leg: Secondary | ICD-10-CM | POA: Insufficient documentation

## 2016-07-20 DIAGNOSIS — M79642 Pain in left hand: Secondary | ICD-10-CM | POA: Insufficient documentation

## 2016-07-20 NOTE — ED Provider Notes (Signed)
[facility]-EMERGENCY DEPT Provider Note   CSN: [id number] Arrival date & time: 07/20/16  [id number]  By signing my name below, I, Cynda AcresHailei Fulton, attest that this documentation has been prepared under the direction and in the presence of [place] Russo, PA-C Electronically Signed: Cynda AcresHailei Fulton, Scribe. 07/20/16. 9:33 AM.  History   Chief Complaint Chief Complaint  Patient presents with  . right neck pain/ skin irritation   HPI Comments: Anthony Silva is a 31 y.o. male with a history of polysubstance abuse and schizophrenia, who presents to the Emergency Department complaining of sudden-onset "fox bites" to the neck, which appeared this morning. Patient believes he was bit by foxes at home all over his body. Patient states upon waking up he had marks all over his body. Patient reports a painful bite to the right-sided neck. Patient was drinking last night while at home. Patient states he has been seeing foxes outside and in his fire place lately. According to chart review the patient was seen by PCP on May 24th, in which he stated that he was seeing "foxes eating a deer outside of his house", which his family did not see. He was started on cariprazine, which he admits that he is not taking due to making him feel "down". Patient denies any SI, HI, auditory hallucinations, or visual hallucinations. Patient does report a history of scabies, in which he states his symptoms are similar. Patient reports associated pain 2/t to reported bites to the bilateral legs and left wrist. No medications taken for his symptoms. Nothing improves or worsens his pain. No pain with neck movement, but he does have pain with palpation. Patient denies any cocaine use, but states he smokes marijuanna on a regular basis. Patient denies any fever, chills, itching, N/T to extremities, bowel or bladder incontinence.   The history is provided by the patient. No language interpreter was used.    Past Medical History:  Diagnosis Date  .  Allergy     Patient Active Problem List   Diagnosis Date Noted  . 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 tooth removal         Home Medications    Prior to Admission medications   Medication Sig Start Date End Date Taking? Authorizing Provider  Cariprazine HCl (VRAYLAR) 1.5 & 3 MG CPPK Take 1 tablet by mouth daily. 06/05/16   Etta GrandchildJones, Thomas L, MD  Cetirizine HCl 10 MG CAPS Take 1 capsule (10 mg total) by mouth daily. 04/29/16   Benjiman CoreWiseman, Brittany D, PA-C  fluticasone (FLONASE) 50 MCG/ACT nasal spray Place 2 sprays into both nostrils daily. 04/29/16   Benjiman CoreWiseman, Brittany D, PA-C  hydrOXYzine (ATARAX/VISTARIL) 50 MG tablet Take 1 tablet (50 mg total) by mouth at bedtime as needed. *office visit needed* 05/12/16   Magdalene RiverWiseman, Brittany D, PA-C  Olopatadine HCl 0.2 % SOLN Apply 1 drop to eye daily. 04/29/16   Magdalene RiverWiseman, Brittany D, PA-C    Family History Family History  Problem Relation Age of Onset  . Diabetes Father   . Diabetes Sister     Social History Social History  Substance Use Topics  . Smoking status: Current Every Day Smoker  . Smokeless tobacco: Never Used  . Alcohol use Yes     Allergies   Patient has no known allergies.   Review of Systems Review of Systems  Constitutional: Negative for chills and fever.  Gastrointestinal:       No bowel incontinence  Genitourinary: Negative for difficulty urinating.  Musculoskeletal: Positive for arthralgias (left wrist, bilateral legs) and neck pain.  Skin: Positive for rash (left wrist, bilateral legs).  Neurological: Negative for weakness and numbness.  Psychiatric/Behavioral: Positive for hallucinations. Negative for self-injury and suicidal ideas.     Physical Exam Updated Vital Signs BP 120/75 (BP Location: Left Arm)   Pulse 88   Temp 97.7 F (36.5 C)   Resp 17   Ht 6\' 4"   (1.93 m)   Wt 77.1 kg (170 lb)   SpO2 99%   BMI 20.69 kg/m   Physical Exam  Constitutional: He appears well-developed and well-nourished. No distress.  HENT:  Head: Normocephalic and atraumatic.  Eyes: Conjunctivae are normal.  Neck:  Right sternocleidomastoid muscle with minimal tenderness, no overlying wound, erythema, or ecchymosis. No spinal or paraspinal tenderness. Normal ROM of the neck.   Cardiovascular: Normal rate, regular rhythm, normal heart sounds and intact distal pulses.   Intact distal pulses.   Pulmonary/Chest: Effort normal and breath sounds normal.  Musculoskeletal: Normal range of motion. He exhibits tenderness. He exhibits no edema or deformity.  Left dorsal hand without gross deformity, ecchymosis, wound, or edema. Mild tenderness over the dorsal hand, normal ROM of the wrist and fingers. Bilateral anterior legs with excoriation. No erythema, edema, or gross deformity. Area is non tender. Normal ROM of lower extremities.   Neurological: He is alert.  5/5 strength in the bilateral upper and lower extremities. Normal sensation. Normal gait.   Psychiatric: He has a normal mood and affect. His speech is normal and behavior is normal. He expresses no homicidal and no suicidal ideation.  Nursing note and vitals reviewed.    ED Treatments / Results  DIAGNOSTIC STUDIES: Oxygen Saturation is 99% on RA, normal by my interpretation.    COORDINATION OF CARE: 9:31 AM Discussed treatment plan with pt at bedside and pt agreed to plan.    Labs (all labs ordered are listed, but only abnormal results are displayed) Labs Reviewed - No data to display  EKG  EKG Interpretation None       Radiology No results found.  Procedures Procedures (including critical care time)  Medications Ordered in ED Medications - No data to display   Initial Impression / Assessment and Plan / ED Course  I have reviewed the triage vital signs and the nursing notes.  Pertinent labs  & imaging results that were available during my care of the patient were reviewed by me and considered in my medical decision making (see chart for details).     Patient w pain to right neck, left wrist and b/l legs, 2/t reported fox bite. Per chart review, pt with hx of hallucinations. He has been noncompliant with antipsychotics. Pt does not exhibit SI or HI. He does not exhibit signs of danger to himself or others. He is not in distress. Provided pt with reassurance that there were no signs of infection of injury to underlying structures. Strongly encouraged pt follow up with his PCP for medication adjustment. Conservative therapy recommended and discussed. Patient will be dc home & is agreeable with above plan.  Patient discussed with Dr. Rubin Payor, who agrees with care plan.  Discussed results, findings, treatment and follow up. Patient advised of return precautions. Patient verbalized understanding and agreed with plan.  Final Clinical Impressions(s) / ED Diagnoses   Final diagnoses:  Neck pain  Hand pain, left  Leg pain, bilateral  New Prescriptions Discharge Medication List as of 07/20/2016  9:32 AM     I personally performed the services described in this documentation, which was scribed in my presence. The recorded information has been reviewed and is accurate.    Russo, [place] N, PA-C 07/20/16 1039    Russo, [place] N, PA-C 07/20/16 1039    Benjiman Core, MD 07/20/16 1252

## 2016-07-20 NOTE — Discharge Instructions (Signed)
Please schedule an appointment with your primary care provider for follow up on your symptoms. Return to the ER for new or concerning symptoms.

## 2016-07-20 NOTE — ED Triage Notes (Signed)
Patient complains of right sided neck pain and burning/discomfort to arms and legs, thinks he was bitten by something. No redness, no swelling, NAD

## 2016-07-20 NOTE — ED Notes (Addendum)
Pt c/o "being bitten all over". Asked "if it could be related to foxes.Peggye Form.I've been seeing foxes in the fire place and outside my house".Hx of thought disorder and substance abuse.

## 2016-07-29 ENCOUNTER — Emergency Department (HOSPITAL_COMMUNITY)
Admission: EM | Admit: 2016-07-29 | Discharge: 2016-07-29 | Disposition: A | Discharge status: Home / Self Care | Payer: BLUE CROSS/BLUE SHIELD | Attending: Emergency Medicine | Admitting: Emergency Medicine

## 2016-07-29 ENCOUNTER — Encounter (HOSPITAL_COMMUNITY): Payer: Self-pay | Admitting: Emergency Medicine

## 2016-07-29 DIAGNOSIS — Z79899 Other long term (current) drug therapy: Secondary | ICD-10-CM | POA: Insufficient documentation

## 2016-07-29 DIAGNOSIS — F17210 Nicotine dependence, cigarettes, uncomplicated: Secondary | ICD-10-CM | POA: Insufficient documentation

## 2016-07-29 DIAGNOSIS — Z9114 Patient's other noncompliance with medication regimen: Secondary | ICD-10-CM | POA: Insufficient documentation

## 2016-07-29 DIAGNOSIS — F14151 Cocaine abuse with cocaine-induced psychotic disorder with hallucinations: Secondary | ICD-10-CM | POA: Diagnosis present

## 2016-07-29 DIAGNOSIS — R440 Auditory hallucinations: Secondary | ICD-10-CM

## 2016-07-29 DIAGNOSIS — Z046 Encounter for general psychiatric examination, requested by authority: Secondary | ICD-10-CM | POA: Insufficient documentation

## 2016-07-29 DIAGNOSIS — R441 Visual hallucinations: Secondary | ICD-10-CM | POA: Insufficient documentation

## 2016-07-29 DIAGNOSIS — F22 Delusional disorders: Secondary | ICD-10-CM

## 2016-07-29 LAB — CBC WITH DIFFERENTIAL/PLATELET
BASOS ABS: 0 10*3/uL (ref 0.0–0.1)
Basophils Relative: 1 %
Eosinophils Absolute: 0 10*3/uL (ref 0.0–0.7)
Eosinophils Relative: 1 %
HEMATOCRIT: 43.9 % (ref 39.0–52.0)
HEMOGLOBIN: 14.7 g/dL (ref 13.0–17.0)
LYMPHS PCT: 28 %
Lymphs Abs: 1.6 10*3/uL (ref 0.7–4.0)
MCH: 28.3 pg (ref 26.0–34.0)
MCHC: 33.5 g/dL (ref 30.0–36.0)
MCV: 84.6 fL (ref 78.0–100.0)
MONO ABS: 0.6 10*3/uL (ref 0.1–1.0)
Monocytes Relative: 11 %
NEUTROS ABS: 3.5 10*3/uL (ref 1.7–7.7)
Neutrophils Relative %: 61 %
Platelets: 234 10*3/uL (ref 150–400)
RBC: 5.19 MIL/uL (ref 4.22–5.81)
RDW: 13.1 % (ref 11.5–15.5)
WBC: 5.8 10*3/uL (ref 4.0–10.5)

## 2016-07-29 LAB — COMPREHENSIVE METABOLIC PANEL
ALBUMIN: 4.7 g/dL (ref 3.5–5.0)
ALK PHOS: 43 U/L (ref 38–126)
ALT: 20 U/L (ref 17–63)
ANION GAP: 11 (ref 5–15)
AST: 31 U/L (ref 15–41)
BILIRUBIN TOTAL: 0.8 mg/dL (ref 0.3–1.2)
BUN: 14 mg/dL (ref 6–20)
CALCIUM: 9.5 mg/dL (ref 8.9–10.3)
CO2: 25 mmol/L (ref 22–32)
Chloride: 104 mmol/L (ref 101–111)
Creatinine, Ser: 1.22 mg/dL (ref 0.61–1.24)
GFR calc non Af Amer: >60 mL/min (ref >60)
GLUCOSE: 118 mg/dL — AB (ref 65–99)
Potassium: 3.5 mmol/L (ref 3.5–5.1)
Sodium: 140 mmol/L (ref 135–145)
TOTAL PROTEIN: 8 g/dL (ref 6.5–8.1)

## 2016-07-29 LAB — RAPID URINE DRUG SCREEN, HOSP PERFORMED
Amphetamines: NOT DETECTED
BARBITURATES: NOT DETECTED
Benzodiazepines: NOT DETECTED
COCAINE: POSITIVE — AB
Opiates: NOT DETECTED
Tetrahydrocannabinol: POSITIVE — AB

## 2016-07-29 LAB — ETHANOL: Alcohol, Ethyl (B): <5 mg/dL (ref <5)

## 2016-07-29 MED ORDER — IBUPROFEN 200 MG PO TABS: 600.0000 mg | ORAL_TABLET | Freq: Three times a day (TID) | ORAL | Status: DC | PRN

## 2016-07-29 MED ORDER — NICOTINE 21 MG/24HR TD PT24
21.0000 mg | MEDICATED_PATCH | Freq: Every day | TRANSDERMAL | Status: DC
  Administered 2016-07-29: 21 mg via TRANSDERMAL
  Filled 2016-07-29: qty 1

## 2016-07-29 MED ORDER — ZOLPIDEM TARTRATE 5 MG PO TABS: 5.0000 mg | ORAL_TABLET | Freq: Every evening | ORAL | Status: DC | PRN

## 2016-07-29 MED ORDER — ONDANSETRON HCL 4 MG PO TABS: 4.0000 mg | ORAL_TABLET | Freq: Three times a day (TID) | ORAL | Status: DC | PRN

## 2016-07-29 MED ORDER — ALUM & MAG HYDROXIDE-SIMETH 200-200-20 MG/5ML PO SUSP: 30.0000 mL | Freq: Four times a day (QID) | ORAL | Status: DC | PRN

## 2016-07-29 MED ORDER — LORAZEPAM 1 MG PO TABS
1.0000 mg | ORAL_TABLET | Freq: Once | ORAL | Status: AC
  Administered 2016-07-29: 1 mg via ORAL
  Filled 2016-07-29: qty 1

## 2016-07-29 NOTE — ED Notes (Signed)
Pt discharged ambulatory with discharge instructions.  Pt was in no distress. He was calm and cooperative.  All belongings were returned to pt.

## 2016-07-29 NOTE — ED Notes (Signed)
Pt sts that he sees "many shadows" and hears people "racking" their guns; he sts that he cant sleep b/c people are watching him outside his house.  Mother is at bedside.  Mom sts that patient was started on a new medication that makes him very sleepy x3 days, so he stopped taking the medication about a week ago.  Pt has hx of schizophrenia

## 2016-07-29 NOTE — ED Triage Notes (Signed)
Pt reports seeing people come after him. Pt mother states that he has been having an episode that people are out to get him. Pt reports feeling paranoid. Pt denies SI/HI

## 2016-07-29 NOTE — ED Provider Notes (Signed)
[facility]-EMERGENCY DEPT Provider Note   CSN: [id number] Arrival date & time: 07/29/16  0012  By signing my name below, I, Ny'Kea Lewis, attest that this documentation has been prepared under the direction and in the presence of Dione Booze, MD. Electronically Signed: Karren Cobble, ED Scribe. 07/29/16. 2:56 AM.  History   Chief Complaint Chief Complaint  Patient presents with  . Hallucinations   The history is provided by the patient and a parent. No language interpreter was used.   HPI Comments: Anthony Silva is a 31 y.o. male with no pertinent history, who presents to the Emergency Department complaining of intermittent, gradually worsening, acute on chronic audible and visual hallucinations for the past few weeks. Pt reports he was in his room when he started to see "shadows of lights flashing into his room" that followed him around his house. He also reports hearing voices that want to cause him harm but without a plan. Per pt's mother, pt has been having intermittent episode of paranoia that past few weeks. Pt has not been taking his prescribed medication due to it "wiping him out". He endorses occasional marijuana, Ecstasy, and alcohol usage. Pt reports smokes two packs of cigarette a day. Denies homicidal or suicidal ideations, abdominal pain, fever or chills.   Past Medical History:  Diagnosis Date  . Allergy    Patient Active Problem List   Diagnosis Date Noted  . 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 tooth removal      Home Medications    Prior to Admission medications   Medication Sig Start Date End Date Taking? Authorizing Provider  aspirin-acetaminophen-caffeine (EXCEDRIN MIGRAINE) [phone] MG tablet Take 2 tablets by mouth every 6 (six) hours as needed for headache or migraine.   Yes [provider]  Cariprazine  HCl (VRAYLAR) 1.5 & 3 MG CPPK Take 1 tablet by mouth daily. 06/05/16   Etta Grandchild, MD  Cetirizine HCl 10 MG CAPS Take 1 capsule (10 mg total) by mouth daily. Patient not taking: Reported on 07/29/2016 04/29/16   Benjiman Core D, PA-C  fluticasone [facility]) 50 MCG/ACT nasal spray Place 2 sprays into both nostrils daily. Patient not taking: Reported on 07/29/2016 04/29/16   Benjiman Core D, PA-C  hydrOXYzine (ATARAX/VISTARIL) 50 MG tablet Take 1 tablet (50 mg total) by mouth at bedtime as needed. *office visit needed* Patient not taking: Reported on 07/29/2016 05/12/16   Benjiman Core D, PA-C  Olopatadine HCl 0.2 % SOLN Apply 1 drop to eye daily. Patient not taking: Reported on 07/29/2016 04/29/16   Magdalene River, PA-C   Family History Family History  Problem Relation Age of Onset  . Diabetes Father   . Diabetes Sister    Social History Social History  Substance Use Topics  . Smoking status: Current Every Day Smoker  . Smokeless tobacco: Never Used  . Alcohol use Yes   Allergies   Patient has no known allergies.  Review of Systems Review of Systems  Constitutional: Negative for chills and fever.  Gastrointestinal: Negative for abdominal pain.  Psychiatric/Behavioral: Positive for hallucinations. Negative for suicidal ideas.  All other systems reviewed and are negative.  Physical Exam Updated Vital Signs BP (!) 162/97 (BP Location: Left Arm)   Pulse 77   Temp 99.9 F (37.7 C) (Oral)   Resp (!) 21   Ht 6\' 4"  (1.93 m)   Wt  170 lb (77.1 kg)   SpO2 100%   BMI 20.69 kg/m   Physical Exam  Constitutional: He is oriented to person, place, and time. He appears well-developed and well-nourished.  HENT:  Head: Normocephalic and atraumatic.  Eyes: Pupils are equal, round, and reactive to light. EOM are normal.  Neck: Normal range of motion. Neck supple. No JVD present.  Cardiovascular: Normal rate, regular rhythm and normal heart sounds.   No murmur  heard. Pulmonary/Chest: Effort normal and breath sounds normal. He has no wheezes. He has no rales. He exhibits no tenderness.  Abdominal: Soft. Bowel sounds are normal. He exhibits no distension and no mass. There is no tenderness.  Musculoskeletal: Normal range of motion. He exhibits no edema.  Lymphadenopathy:    He has no cervical adenopathy.  Neurological: He is alert and oriented to person, place, and time. No cranial nerve deficit. He exhibits normal muscle tone. Coordination normal.  Skin: Skin is warm and dry. No rash noted.  Psychiatric:  Somewhat animated and expressing paranoid ideation.   Nursing note and vitals reviewed.   ED Treatments / Results  DIAGNOSTIC STUDIES: Oxygen Saturation is 100% on RA, normal by my interpretation.   COORDINATION OF CARE: 2:44 AM-Discussed next steps with pt. Pt verbalized understanding and is agreeable with the plan.   Labs (all labs ordered are listed, but only abnormal results are displayed) Labs Reviewed  COMPREHENSIVE METABOLIC PANEL - Abnormal; Notable for the following:       Result Value   Glucose, Bld 118 (*)    All other components within normal limits  ETHANOL  CBC WITH DIFFERENTIAL/PLATELET  RAPID URINE DRUG SCREEN, HOSP PERFORMED    Procedures Procedures (including critical care time)  Medications Ordered in ED Medications  LORazepam (ATIVAN) tablet 1 mg (not administered)   Initial Impression / Assessment and Plan / ED Course  I have reviewed the triage vital signs and the nursing notes.  Pertinent lab results that were available during my care of the patient were reviewed by me and considered in my medical decision making (see chart for details).  Paranoid ideation with both visual and auditory hallucinations. Overall picture is consistent with paranoid schizophrenia. Old records are reviewed, and he has no relevant past visits. He is being held in the ED for psychiatric evaluation.  Final Clinical  Impressions(s) / ED Diagnoses   Final diagnoses:  Paranoid ideation (HCC)  Visual hallucination  Auditory hallucinations    New Prescriptions New Prescriptions   No medications on file   I personally performed the services described in this documentation, which was scribed in my presence. The recorded information has been reviewed and is accurate.       Dione BoozeGlick, Mariaclara Spear, MD 07/29/16 [phone]

## 2016-07-29 NOTE — [facility] Suicide Risk Assessment (Signed)
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 rate 20, height 6\' 4"  (1.93 m), weight 77.1 kg (170 lb), SpO2 100 %.Body mass index is 20.69 kg/m.  General Appearance: Casual  Eye Contact::  Good  Speech:  Normal Rate409  Volume:  Normal  Mood:  Euthymic  Affect:  Congruent  Thought Process:  Coherent and Descriptions of Associations: Intact  Orientation:  Full (Time, Place, and Person)  Thought Content:  WDL and Logical  Suicidal Thoughts:  No  Homicidal Thoughts:  No  Memory:  Immediate;   Good Recent;   Good Remote;   Good  Judgement:  Fair  Insight:  Fair  Psychomotor Activity:  Normal  Concentration:  Good  Recall:  Fair  Fund of Knowledge:Fair  Language: Good  Akathisia:  No  Handed:  Right  AIMS (if indicated):     Assets:  Housing Leisure Time Physical Health Resilience Social Support  Sleep:     Cognition: WNL  ADL's:  Intact   Mental Status Per Nursing Assessment::   On Admission:   Cocaine abuse with psychosis.  Today, he is clear and coherent with no psychosis.  Educated about the use of cocaine and psychosis.  Starting a new job this week, reminded him of drug testing.  No suicidal/homicidal ideations or withdrawal symptoms.  Stable for discharge.  Demographic Factors:  Male and Adolescent or young adult  Loss Factors: NA  Historical  Factors: NA  Risk Reduction Factors:   Sense of responsibility to family, Living with another person, especially a relative and Positive social support  Continued Clinical Symptoms:  None  Cognitive Features That Contribute To Risk:  None    Suicide Risk:  Minimal: No identifiable suicidal ideation.  Patients presenting with no risk factors but with morbid ruminations; may be classified as minimal risk based on the severity of the depressive symptoms    Plan Of Care/Follow-up recommendations:  Activity:  as tolerated Diet:  heart healthy diet  LORD, JAMISON, NP 07/29/2016, 9:55 AM

## 2016-07-29 NOTE — ED Notes (Signed)
ED Provider at bedside. Dr. Preston Fleetingglick

## 2016-07-29 NOTE — ED Notes (Signed)
Family at bedside. mother 

## 2016-07-29 NOTE — BH Assessment (Addendum)
Tele Assessment Note   Anthony Silva is an 31 y.o. male.  -Clinician reviewed note by Dr. Preston Fleeting.  Anthony Silva is a 31 y.o. male with no pertinent history, who presents to the Emergency Department complaining of intermittent, gradually worsening, acute on chronic audible and visual hallucinations for the past few weeks. Pt reports he was in his room when he started to see "shadows of lights flashing into his room" that followed him around his house. He also reports hearing voices that want to cause him harm but without a plan. Per pt's mother, pt has been having intermittent episode of paranoia that past few weeks.  Mother said that patient believed that people were following them on the way to the hospital, that people were talking about him and conspiring against him.  When at home he was going from window to window, convinced people were outside trying to get to him.  He was hearing voices of people outside talking about him.  Patient has been seeing foxes in the house, coming in and out of the chimney.  He feels like he has been having something sniffing him, can feel breathing on his hands and arms.  Patient has a hard time giving a clear answer to questions.  He admits to feeling paranoid.  He will stay in bed for a long time.  He has been having mood swings between depression and mania.    Patient says he drinks about one beer every few days.  He has been using marijuana daily.  Patient is positive for cocaine but denies use.  He said that he has used ecstasy in the last few weeks.  Patient has no previous inpatient or outpatient care experience.  Patient's medication is prescribed by family doctor.  Patient wants to get help.  -Clinician discussed patient care with Donell Sievert, PA who recommends inpatient psychiatric care.  TTS to seek placement.  Diagnosis: Bipolar d/o w/ psychotic features; Cannabis use d/o severe  Past Medical History:  Past Medical History:  Diagnosis Date   . Allergy     Past Surgical History:  Procedure Laterality Date  . tooth removal      Family History:  Family History  Problem Relation Age of Onset  . Diabetes Father   . Diabetes Sister     Social History:  reports that he has been smoking.  He has never used smokeless tobacco. He reports that he drinks alcohol. He reports that he uses drugs, including Marijuana and Cocaine.  Additional Social History:  Alcohol / Drug Use Pain Medications: None Prescriptions: Hydroxizine (needs to be refilled); Varylor Over the Counter: Advil or allergy meds on a as needed basis.   History of alcohol / drug use?: Yes Substance #1 Name of Substance 1: Marijuana 1 - Age of First Use: 31 years of age 31 - Amount (size/oz): One blunt in a day 1 - Frequency: 2-3 days out of the week 1 - Duration: off and on 1 - Last Use / Amount: 07/15 Substance #2 Name of Substance 2: Cocaine 2 - Age of First Use: Around 31 years of age 8 - Amount (size/oz): Varies 2 - Frequency: Varies 2 - Duration: says it is off and on 2 - Last Use / Amount: Pt says he does not use but it is in UDS  CIWA: CIWA-Ar BP: 133/74 Pulse Rate: 77 COWS:    PATIENT STRENGTHS: (choose at least two) Average or above average intelligence Communication skills Supportive family/friends  Allergies:  No Known Allergies  Home Medications:  (Not in a hospital admission)  OB/GYN Status:  No LMP for male patient.  General Assessment Data Location of Assessment: [facility] TTS Assessment: In system Is this a Tele or Face-to-Face Assessment?: Face-to-Face Is this an Initial Assessment or a Re-assessment for this encounter?: Initial Assessment Marital status: Single Is patient pregnant?: No Pregnancy Status: No Living Arrangements: Parent (Living with mother and father.) Can pt return to current living arrangement?: Yes Admission Status: Voluntary Is patient capable of signing voluntary admission?: Yes Referral Source:  Self/Family/Friend Insurance type: None     Crisis Care Plan Living Arrangements: Parent (Living with mother and father.) Name of Psychiatrist: None Name of Therapist: None  Education Status Is patient currently in school?: No Highest grade of school patient has completed: 3 years at [employer]  Risk to self with the past 6 months Suicidal Ideation: No Has patient been a risk to self within the past 6 months prior to admission? : No Suicidal Intent: No Has patient had any suicidal intent within the past 6 months prior to admission? : No Is patient at risk for suicide?: No Suicidal Plan?: No Has patient had any suicidal plan within the past 6 months prior to admission? : No Access to Means: No What has been your use of drugs/alcohol within the last 12 months?: Cocaine, THC Previous Attempts/Gestures: No How many times?: 0 Other Self Harm Risks: None Triggers for Past Attempts: None known Intentional Self Injurious Behavior: None Family Suicide History: No Recent stressful life event(s): Other (Comment) (Stress "in [place].") Persecutory voices/beliefs?: Yes Depression: Yes Depression Symptoms: Despondent, Isolating, Loss of interest in usual pleasures, Insomnia Substance abuse history and/or treatment for substance abuse?: Yes Suicide prevention information given to non-admitted patients: Not applicable  Risk to Others within the past 6 months Homicidal Ideation: No Does patient have any lifetime risk of violence toward others beyond the six months prior to admission? : No Thoughts of Harm to Others: No Current Homicidal Intent: No Current Homicidal Plan: No Access to Homicidal Means: No Identified Victim: No one History of harm to others?: No Assessment of Violence: None Noted Violent Behavior Description: None reported Does patient have access to weapons?: No Criminal Charges Pending?: No Does patient have a court date: No Is patient on probation?:  No  Psychosis Hallucinations: Auditory, Visual (Hearing a voice; things moving in closet) Delusions: Grandiose  Mental Status Report Appearance/Hygiene: Unremarkable, In scrubs Eye Contact: Good Motor Activity: Freedom of movement, Unremarkable Speech: Logical/coherent Level of Consciousness: Alert Mood: Anxious, Apprehensive Affect: Apprehensive, Anxious Anxiety Level: Panic Attacks Panic attack frequency: Situational Most recent panic attack: Tonight Thought Processes: Tangential Judgement: Impaired Orientation: Person, Place, Time, Situation Obsessive Compulsive Thoughts/Behaviors: None  Cognitive Functioning Concentration: Decreased Memory: Remote Intact, Recent Intact IQ: Average Insight: Poor Impulse Control: Poor Appetite: Good Weight Loss: 0 Weight Gain: 0 Sleep: Decreased Total Hours of Sleep:  (<6H/D) Vegetative Symptoms: Staying in bed, Decreased grooming  ADLScreening [facility] Assessment Services) Patient's cognitive ability adequate to safely complete daily activities?: Yes Patient able to express need for assistance with ADLs?: Yes Independently performs ADLs?: Yes (appropriate for developmental age)  Prior Inpatient Therapy Prior Inpatient Therapy: No Prior Therapy Dates: None Prior Therapy Facilty/Provider(s): None Reason for Treatment: None  Prior Outpatient Therapy Prior Outpatient Therapy: No Prior Therapy Dates: N/A Prior Therapy Facilty/Provider(s): N/A Reason for Treatment: N/A Does patient have an ACCT team?: No Does patient have Intensive In-House Services?  : No Does patient have  [facility] services? : No Does patient have P4CC services?: No  ADL Screening (condition at time of admission) Patient's cognitive ability adequate to safely complete daily activities?: Yes Is the patient deaf or have difficulty hearing?: No Does the patient have difficulty seeing, even when wearing glasses/contacts?: No Does the patient have difficulty  concentrating, remembering, or making decisions?: No Patient able to express need for assistance with ADLs?: Yes Does the patient have difficulty dressing or bathing?: No Independently performs ADLs?: Yes (appropriate for developmental age) Does the patient have difficulty walking or climbing stairs?: No Weakness of Legs: None Weakness of Arms/Hands: None       Abuse/Neglect Assessment (Assessment to be complete while patient is alone) Physical Abuse: Denies Verbal Abuse: Denies Sexual Abuse: Denies Exploitation of patient/patient's resources: Denies Self-Neglect: Denies     Merchant navy officerAdvance Directives (For Healthcare) Does Patient Have a Medical Advance Directive?: No Would patient like information on creating a medical advance directive?: No - Patient declined    Additional Information 1:1 In Past 12 Months?: No CIRT Risk: No Elopement Risk: No Does patient have medical clearance?: Yes     Disposition:  Disposition Initial Assessment Completed for this Encounter: Yes Disposition of Patient: Inpatient treatment program, Referred to Type of inpatient treatment program: Adult Patient referred to: Other (Comment) (Pt to be reviewed by PA)  Beatriz StallionHarvey, Lydell Moga Ray 07/29/2016 7:00 AM

## 2016-07-29 NOTE — BH Assessment (Signed)
[facility] Assessment Progress Note  Per Thedore MinsMojeed Akintayo, MD, this pt does not require psychiatric hospitalization at this time.  [facility].  This has been included in pt's discharge instructions.  Pt's nurse, Kendal Hymendie, has been notified.  Doylene Canninghomas Jamerius Boeckman, MA Triage Specialist [phone]

## 2016-07-29 NOTE — ED Notes (Signed)
Urine specimen requested 

## 2016-07-29 NOTE — ED Notes (Signed)
Pt oriented to room and unit.  Pt arrived pleasant and cooperative.  Pt checked for contraband  Without incident.  No contraband found.  15 minute checks and video monitoring in place.

## 2016-07-29 NOTE — Discharge Instructions (Signed)
For your behavioral health needs, you are advised to follow up with [facility].  Contact them at your earliest opportunity to ask about scheduling an intake appointment: ° °     [facility] °     [address] °     [place], [place] [address] °     [phone] °

## 2017-05-06 ENCOUNTER — Other Ambulatory Visit: Payer: Self-pay | Admitting: Physician Assistant

## 2017-05-06 DIAGNOSIS — J309 Allergic rhinitis, unspecified: Secondary | ICD-10-CM

## 2017-05-06 NOTE — Telephone Encounter (Signed)
Rx refill from historical provider  Cetirizine 10 mg   LOV: 06/05/16  PCP: Yetta BarreJones  Pharmacy: verified

## 2017-05-06 NOTE — Telephone Encounter (Signed)
Pt was to follow up with PCP in June 2018. Can we have pt schedule an appt?

## 2017-05-07 ENCOUNTER — Other Ambulatory Visit: Payer: Self-pay | Admitting: Physician Assistant

## 2017-05-07 DIAGNOSIS — H1013 Acute atopic conjunctivitis, bilateral: Secondary | ICD-10-CM

## 2017-05-07 NOTE — Telephone Encounter (Signed)
Left message for patient to call back to schedule a follow up appointment with Dr Yetta BarreJones.

## 2017-10-11 ENCOUNTER — Encounter (HOSPITAL_COMMUNITY): Payer: Self-pay | Admitting: Urgent Care

## 2017-10-11 ENCOUNTER — Ambulatory Visit (HOSPITAL_COMMUNITY)
Admission: EM | Admit: 2017-10-11 | Discharge: 2017-10-11 | Disposition: A | Discharge status: Home / Self Care | Payer: BLUE CROSS/BLUE SHIELD | Attending: Urgent Care | Admitting: Urgent Care

## 2017-10-11 ENCOUNTER — Other Ambulatory Visit: Payer: Self-pay

## 2017-10-11 DIAGNOSIS — R6889 Other general symptoms and signs: Secondary | ICD-10-CM

## 2017-10-11 DIAGNOSIS — R0981 Nasal congestion: Secondary | ICD-10-CM

## 2017-10-11 DIAGNOSIS — R5381 Other malaise: Secondary | ICD-10-CM

## 2017-10-11 DIAGNOSIS — R52 Pain, unspecified: Secondary | ICD-10-CM

## 2017-10-11 MED ORDER — ONDANSETRON 8 MG PO TBDP: 8.0000 mg | ORAL_TABLET | Freq: Three times a day (TID) | ORAL | 0 refills | Status: DC | PRN

## 2017-10-11 MED ORDER — ONDANSETRON 4 MG PO TBDP
ORAL_TABLET | ORAL | Status: AC
  Filled 2017-10-11: qty 2

## 2017-10-11 MED ORDER — ONDANSETRON 4 MG PO TBDP
8.0000 mg | ORAL_TABLET | Freq: Once | ORAL | Status: AC
Start: 2017-10-11 — End: 2017-10-11
  Administered 2017-10-11: 8 mg via ORAL

## 2017-10-11 MED ORDER — PSEUDOEPHEDRINE HCL ER 120 MG PO TB12: 120.0000 mg | ORAL_TABLET | Freq: Two times a day (BID) | ORAL | 3 refills | Status: DC

## 2017-10-11 NOTE — Discharge Instructions (Signed)
You may take 500mg  Tylenol with ibuprofen 600mg  every 6 hours for pain and inflammation. For sore throat try using a honey-based tea. Use 3 teaspoons of honey with juice squeezed from half lemon. Place shaved pieces of ginger into 1/2-1 cup of water and warm over stove top. Then mix the ingredients and repeat every 4 hours as needed. Hydrate well with at least 2 liters (64 ounces) of water daily.

## 2017-10-11 NOTE — ED Triage Notes (Signed)
Pt states chills, fever, and body aches and headaches and fatigue x 2 days

## 2017-10-11 NOTE — ED Provider Notes (Signed)
  MRN: [id number] DOB: [birth year]  Subjective:   Anthony Silva is a 32 y.o. male presenting for 2 day history of body aches, nasal congestion, nausea with vomiting (1 episode today), subjective fever, chills, generalized headache, decreased appetite. Has tried Alleve once every 4 hours. Denies cough, ear pain, sinus pain.  reports that he has been smoking. He has never used smokeless tobacco. He reports that he drinks alcohol. He reports that he has current or past drug history. Drugs: Marijuana and Cocaine.   Takes cetirizine for allergies.    No Known Allergies  Past Medical History:  Diagnosis Date  . Allergy      Past Surgical History:  Procedure Laterality Date  . tooth removal      Objective:   Vitals: BP 103/69 (BP Location: Right Arm)   Pulse 78   Temp 98.6 F (37 C)   Resp 16   SpO2 100%   Physical Exam  Constitutional: He is oriented to person, place, and time. He appears well-developed and well-nourished.  HENT:  Right Ear: Tympanic membrane normal.  Left Ear: Tympanic membrane normal.  Mouth/Throat: No oropharyngeal exudate.  Eyes: Right eye exhibits no discharge. Left eye exhibits no discharge. No scleral icterus.  Cardiovascular: Normal rate, regular rhythm, normal heart sounds and intact distal pulses. Exam reveals no gallop and no friction rub.  No murmur heard. Pulmonary/Chest: Effort normal and breath sounds normal. No stridor. No respiratory distress. He has no wheezes. He has no rales.  Neurological: He is alert and oriented to person, place, and time.   Assessment and Plan :   Flu-like symptoms  Malaise  Body aches  Nasal congestion  Likely viral in etiology d/t reassuring physical exam findings. Advised supportive care, offered symptomatic relief. Return-to-clinic precautions discussed, patient verbalized understanding.       Wallis Bamberg, [place] 10/11/17 1616

## 2018-01-25 ENCOUNTER — Encounter (HOSPITAL_COMMUNITY): Payer: Self-pay | Admitting: Emergency Medicine

## 2018-01-25 ENCOUNTER — Other Ambulatory Visit: Payer: Self-pay

## 2018-01-25 ENCOUNTER — Ambulatory Visit (HOSPITAL_COMMUNITY)
Admission: EM | Admit: 2018-01-25 | Discharge: 2018-01-25 | Disposition: A | Discharge status: Home / Self Care | Payer: PRIVATE HEALTH INSURANCE | Attending: Family Medicine | Admitting: Family Medicine

## 2018-01-25 DIAGNOSIS — R590 Localized enlarged lymph nodes: Secondary | ICD-10-CM | POA: Diagnosis not present

## 2018-01-25 DIAGNOSIS — R6889 Other general symptoms and signs: Secondary | ICD-10-CM | POA: Diagnosis present

## 2018-01-25 MED ORDER — OSELTAMIVIR PHOSPHATE 75 MG PO CAPS: 75.0000 mg | ORAL_CAPSULE | Freq: Two times a day (BID) | ORAL | 0 refills | Status: DC

## 2018-01-25 MED ORDER — BENZONATATE 100 MG PO CAPS: 100.0000 mg | ORAL_CAPSULE | Freq: Three times a day (TID) | ORAL | 0 refills | Status: DC | PRN

## 2018-01-25 MED ORDER — ONDANSETRON 8 MG PO TBDP: 8.0000 mg | ORAL_TABLET | Freq: Three times a day (TID) | ORAL | 0 refills | Status: DC | PRN

## 2018-01-25 MED ORDER — ALBUTEROL SULFATE HFA 108 (90 BASE) MCG/ACT IN AERS: 2.0000 | INHALATION_SPRAY | RESPIRATORY_TRACT | 1 refills | Status: DC | PRN

## 2018-01-25 NOTE — Discharge Instructions (Addendum)
Please return if swollen glands in groin persist.

## 2018-01-25 NOTE — ED Provider Notes (Addendum)
[facility]    CSN: [id number] Arrival date & time: 01/25/18  1259     History   Chief Complaint Chief Complaint  Patient presents with  . URI    HPI Anthony Silva is a 33 y.o. male.    Pt reports a cough and some nasal congestion for 3-4 days.  He has been taking Robitussin and Mucinex.  He states the cough hurts a lot.  He was tolerating the symptoms until last night when he became diaphoretic and the cough got much worse.  He started to wheeze as well.  He has been nauseated but has not had any vomiting.  Patient also complains about swollen lymph nodes in his groin that he has had for a week.  They are tender.  He has had no dysuria or discharge.  He has had no rash in the groin or in his lower extremities.  He is worried about having an STD.  Patient has had ongoing sexual contact.  He works in [employer].       Past Medical History:  Diagnosis Date  . Allergy     Patient Active Problem List   Diagnosis Date Noted  . 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 tooth removal         Home Medications    Prior to Admission medications   Medication Sig Start Date End Date Taking? Authorizing Provider  albuterol (PROVENTIL HFA;VENTOLIN HFA) 108 (90 Base) MCG/ACT inhaler Inhale 2 puffs into the lungs every 4 (four) hours as needed for wheezing or shortness of breath (cough, shortness of breath or wheezing.). 01/25/18   Elvina SidleLauenstein, Aaryn Sermon, MD  azithromycin (ZITHROMAX) 250 MG tablet Take 4 tablets (1,000 mg total) by mouth once for 1 dose. Take first 2 tablets together, then 1 every day until finished. 01/27/18 01/27/18  Eustace MooreNelson, Yvonne Sue, MD  benzonatate (TESSALON) 100 MG capsule Take 1-2 capsules (100-200 mg total) by mouth 3 (three) times daily as needed for cough. 01/25/18   Elvina SidleLauenstein, Neeva Trew, MD  ondansetron (ZOFRAN-ODT) 8 MG disintegrating tablet  Take 1 tablet (8 mg total) by mouth every 8 (eight) hours as needed for nausea. 01/25/18   Elvina SidleLauenstein, Donaldson Richter, MD  oseltamivir (TAMIFLU) 75 MG capsule Take 1 capsule (75 mg total) by mouth every 12 (twelve) hours. 01/25/18   Elvina SidleLauenstein, Harshaan Whang, MD    Family History Family History  Problem Relation Age of Onset  . Diabetes Father   . Diabetes Sister     Social History Social History   Tobacco Use  . Smoking status: Current Every Day Smoker  . Smokeless tobacco: Never Used  Substance Use Topics  . Alcohol use: Yes  . Drug use: Yes    Types: Marijuana, Cocaine     Allergies   Patient has no known allergies.   Review of Systems Review of Systems   Physical Exam Triage Vital Signs ED Triage Vitals  Enc Vitals Group     BP 01/25/18 1358 128/75     Pulse Rate 01/25/18 1358 67     Resp 01/25/18 1358 16     Temp 01/25/18 1358 98.2 F (36.8 C)     Temp Source 01/25/18 1358 Oral     SpO2 01/25/18 1358 100 %     Weight --      Height --      Head Circumference --      Peak Flow --  Pain Score 01/25/18 1401 4     Pain Loc --      Pain Edu? --      Excl. in GC? --    No data found.  Updated Vital Signs BP 128/75 (BP Location: Left Arm)   Pulse 67   Temp 98.2 F (36.8 C) (Oral)   Resp 16   SpO2 100%   Physical Exam Vitals signs and nursing note reviewed.  Constitutional:      Appearance: Normal appearance.  HENT:     Head: Normocephalic and atraumatic.     Right Ear: Tympanic membrane and external ear normal.     Left Ear: Tympanic membrane and external ear normal.     Nose: Congestion present.     Mouth/Throat:     Mouth: Mucous membranes are moist.  Eyes:     Conjunctiva/sclera: Conjunctivae normal.  Neck:     Musculoskeletal: Normal range of motion and neck supple.  Cardiovascular:     Rate and Rhythm: Normal rate and regular rhythm.     Heart sounds: Normal heart sounds.  Pulmonary:     Effort: Pulmonary effort is normal.     Breath sounds:  Wheezing present.  Musculoskeletal: Normal range of motion.  Skin:    General: Skin is warm and dry.     Comments: Patient has palpable, tender, mid inguinal 1 cm nodes that are mobile.  Penis and surrounding skin are normal  Neurological:     General: No focal deficit present.     Mental Status: He is alert and oriented to person, place, and time.  Psychiatric:        Mood and Affect: Mood normal.      UC Treatments / Results  Labs (all labs ordered are listed, but only abnormal results are displayed) Labs Reviewed  URINE CYTOLOGY ANCILLARY ONLY - Abnormal; Notable for the following components:      Result Value   Chlamydia **POSITIVE** (*)    All other components within normal limits    EKG None  Radiology No results found.  Procedures Procedures (including critical care time)  Medications Ordered in UC Medications - No data to display  Initial Impression / Assessment and Plan / UC Course  I have reviewed the triage vital signs and the nursing notes.  Pertinent labs & imaging results that were available during my care of the patient were reviewed by me and considered in my medical decision making (see chart for details).    Final Clinical Impressions(s) / UC Diagnoses   Final diagnoses:  Flu-like symptoms  Inguinal lymphadenopathy     Discharge Instructions     Please return if swollen glands in groin persist.    ED Prescriptions    Medication Sig Dispense Auth. Provider   ondansetron (ZOFRAN-ODT) 8 MG disintegrating tablet Take 1 tablet (8 mg total) by mouth every 8 (eight) hours as needed for nausea. 15 tablet Elvina SidleLauenstein, Lynkin Saini, MD   oseltamivir (TAMIFLU) 75 MG capsule Take 1 capsule (75 mg total) by mouth every 12 (twelve) hours. 10 capsule Elvina SidleLauenstein, Karne Ozga, MD   benzonatate (TESSALON) 100 MG capsule Take 1-2 capsules (100-200 mg total) by mouth 3 (three) times daily as needed for cough. 40 capsule Elvina SidleLauenstein, Haward Pope, MD   albuterol (PROVENTIL  HFA;VENTOLIN HFA) 108 (90 Base) MCG/ACT inhaler Inhale 2 puffs into the lungs every 4 (four) hours as needed for wheezing or shortness of breath (cough, shortness of breath or wheezing.). 1 Inhaler Elvina SidleLauenstein, Nieves Barberi, MD  Controlled Substance Prescriptions [place] Controlled Substance Registry consulted? Not Applicable   Elvina Sidle, MD 01/26/18 1002    Elvina Sidle, MD 01/27/18 1229

## 2018-01-25 NOTE — ED Triage Notes (Signed)
Pt reports a cough and some nasal congestion for 3-4 days.  He has been taking Robitussin and Mucinex.  He states the cough hurts a lot.

## 2018-01-26 LAB — URINE CYTOLOGY ANCILLARY ONLY
Chlamydia: POSITIVE — AB
Neisseria Gonorrhea: NEGATIVE
Trichomonas: NEGATIVE

## 2018-01-27 ENCOUNTER — Telehealth (HOSPITAL_COMMUNITY): Payer: Self-pay | Admitting: Emergency Medicine

## 2018-01-27 MED ORDER — AZITHROMYCIN 250 MG PO TABS: 1000.0000 mg | ORAL_TABLET | Freq: Once | ORAL | 0 refills | Status: DC

## 2018-01-27 MED ORDER — AZITHROMYCIN 250 MG PO TABS: 1000.0000 mg | ORAL_TABLET | Freq: Once | ORAL | 0 refills | Status: AC

## 2018-01-27 NOTE — Telephone Encounter (Signed)
Chlamydia is positive.  Rx po zithromax 1g #1 dose no refills was sent to the pharmacy of record.  Pt needs education to please refrain from sexual intercourse for 7 days to give the medicine time to work, sexual partners need to be notified and tested/treated.  Condoms may reduce risk of reinfection.  Recheck or followup with PCP for further evaluation if symptoms are not improving.   [facility] notified.

## 2018-02-14 ENCOUNTER — Other Ambulatory Visit: Payer: Self-pay | Admitting: Physician Assistant

## 2018-02-14 DIAGNOSIS — J309 Allergic rhinitis, unspecified: Secondary | ICD-10-CM

## 2018-02-15 NOTE — Telephone Encounter (Signed)
Requested medication (s) are due for refill today: Yes  Requested medication (s) are on the active medication list: No  Last refill:  Unknown  Future visit scheduled: No  Notes to clinic:  Unable to refill per protocol.     Requested Prescriptions  Pending Prescriptions Disp Refills   cetirizine (ZYRTEC) 10 MG tablet [Pharmacy Med Name: CETIRIZINE HCL 10 MG TABLET] 90 tablet 1    Sig: TAKE 1 CAPSULE (10 MG TOTAL) BY MOUTH DAILY.     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

## 2018-04-12 ENCOUNTER — Other Ambulatory Visit: Payer: Self-pay | Admitting: Physician Assistant

## 2018-04-12 DIAGNOSIS — H1013 Acute atopic conjunctivitis, bilateral: Secondary | ICD-10-CM

## 2018-04-12 DIAGNOSIS — J309 Allergic rhinitis, unspecified: Secondary | ICD-10-CM

## 2020-04-26 ENCOUNTER — Other Ambulatory Visit: Payer: Self-pay

## 2020-04-26 ENCOUNTER — Ambulatory Visit (INDEPENDENT_AMBULATORY_CARE_PROVIDER_SITE_OTHER): Payer: BLUE CROSS/BLUE SHIELD

## 2020-04-26 ENCOUNTER — Ambulatory Visit
Admission: EM | Admit: 2020-04-26 | Discharge: 2020-04-26 | Disposition: A | Discharge status: Home / Self Care | Payer: BLUE CROSS/BLUE SHIELD | Attending: Family Medicine | Admitting: Family Medicine

## 2020-04-26 DIAGNOSIS — M545 Low back pain, unspecified: Secondary | ICD-10-CM | POA: Diagnosis not present

## 2020-04-26 DIAGNOSIS — M5136 Other intervertebral disc degeneration, lumbar region: Secondary | ICD-10-CM

## 2020-04-26 MED ORDER — TIZANIDINE HCL 4 MG PO TABS: 4.0000 mg | ORAL_TABLET | Freq: Four times a day (QID) | ORAL | 0 refills | Status: DC | PRN

## 2020-04-26 MED ORDER — PREDNISONE 20 MG PO TABS: 40.0000 mg | ORAL_TABLET | Freq: Every day | ORAL | 0 refills | Status: DC

## 2020-04-26 NOTE — ED Triage Notes (Signed)
Pt states strained his lower back while pulling a crate last night at work. States unable to see his chiropractor today and unable to return to work until he is evaluated. States will need a work note for both of his jobs. c/o lower back pain without radiating.

## 2020-04-26 NOTE — ED Provider Notes (Signed)
[facility]    CSN: [id number] Arrival date & time: 04/26/20  1424      History   Chief Complaint Chief Complaint  Patient presents with  . Back Pain    HPI Anthony Silva is a 35 y.o. male.   HPI  Patient reports while working last night he was pulling a crate and subsequently injured his back.  He reports a history of lumbar spine pain and has been followed by chiropractor for several years and reports some asymmetry in his hips for which she has been treated by a chiropractor.  He was unable to see his chiropractor and reports he was sent home from work last night from work due to back pain and was advised that he would need to be evaluated prior to being medically cleared to return back to work.  Past Medical History:  Diagnosis Date  . Allergy     Patient Active Problem List   Diagnosis Date Noted  . 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 tooth removal         Home Medications    Prior to Admission medications   Medication Sig Start Date End Date Taking? Authorizing Provider  predniSONE (DELTASONE) 20 MG tablet Take 2 tablets (40 mg total) by mouth daily with breakfast. 04/26/20  Yes Bing Neighbors, FNP  tiZANidine (ZANAFLEX) 4 MG tablet Take 1 tablet (4 mg total) by mouth every 6 (six) hours as needed for muscle spasms. 04/26/20  Yes Bing Neighbors, FNP  albuterol (PROVENTIL HFA;VENTOLIN HFA) 108 (90 Base) MCG/ACT inhaler Inhale 2 puffs into the lungs every 4 (four) hours as needed for wheezing or shortness of breath (cough, shortness of breath or wheezing.). 01/25/18   Elvina Sidle, MD    Family History Family History  Problem Relation Age of Onset  . Diabetes Father   . Diabetes Sister     Social History Social History   Tobacco Use  . Smoking status: Current Every Day Smoker  . Smokeless tobacco: Never Used   Substance Use Topics  . Alcohol use: Yes  . Drug use: Yes    Types: Marijuana, Cocaine     Allergies   Patient has no known allergies.   Review of Systems Review of Systems Pertinent negatives listed in HPI  Physical Exam Triage Vital Signs ED Triage Vitals [04/26/20 1550]  Enc Vitals Group     BP 132/90     Pulse Rate 68     Resp 18     Temp 98 F (36.7 C)     Temp Source Oral     SpO2 97 %     Weight      Height      Head Circumference      Peak Flow      Pain Score 5     Pain Loc      Pain Edu?      Excl. in GC?    No data found.  Updated Vital Signs BP 132/90 (BP Location: Left Arm)   Pulse 68   Temp 98 F (36.7 C) (Oral)   Resp 18   SpO2 97%   Visual Acuity Right Eye Distance:   Left Eye Distance:   Bilateral Distance:    Right Eye Near:   Left Eye Near:    Bilateral Near:     Physical Exam General appearance: alert, well developed,  well nourished, cooperative  Head: Normocephalic, without obvious abnormality, atraumatic Respiratory: Respirations even and unlabored, normal respiratory rate Heart: rate and rhythm normal. No gallop or murmurs noted on exam  Abdomen: BS +, no distention, no rebound tenderness, or no mass Lumbar spine:  no gross deformities Skin: Skin color, texture, turgor normal. No rashes seen  Psych: Appropriate mood and affect. Neurologic: Mental status: Alert, oriented to person, place, and time, thought content appropriate.   UC Treatments / Results  Labs (all labs ordered are listed, but only abnormal results are displayed) Labs Reviewed - No data to display  EKG   Radiology DG Lumbar Spine Complete  Result Date: 04/26/2020 CLINICAL DATA:  Back pain after pulling a crate last night EXAM: LUMBAR SPINE - COMPLETE 4+ VIEW COMPARISON:  None FINDINGS: Five non-rib-bearing lumbar vertebra. Mild disc space narrowing L4-L5 with minimal retrolisthesis. Vertebral body and disc space heights otherwise maintained. No acute  fracture, additional subluxation, or bone destruction. No spondylolysis. Visualized SI joints unremarkable. IMPRESSION: Mild degenerative disc disease changes at L4-L5 with minimal retrolisthesis. Remainder of exam unremarkable. Electronically Signed   By: Ulyses Southward M.D.   On: 04/26/2020 16:30    Procedures Procedures (including critical care time)  Medications Ordered in UC Medications - No data to display  Initial Impression / Assessment and Plan / UC Course  I have reviewed the triage vital signs and the nursing notes.  Pertinent labs & imaging results that were available during my care of the patient were reviewed by me and considered in my medical decision making (see chart for details).     Patient presents today with acute on chronic lower back pain which is exacerbated by pulling a crated at work yesterday.  Imaging of the lumbar spine negative for any acute fracture or misalignment.  Will treat with prednisone 40 mg once daily for 5 days and for acute pain we will treat with tizanidine 4 mg every 6 hours as needed.  Patient given 1 day out of work to allow medication to resolve symptoms.  Patient is cleared to return to work on 04/28/2020 without restrictions.  Final Clinical Impressions(s) / UC Diagnoses   Final diagnoses:  Bilateral low back pain without sciatica, unspecified chronicity  Degenerative disc disease, lumbar     Discharge Instructions     Your x-ray is negative for any fracture or acute injury. Treating for chronic back flare with prednisone 40 mg once daily for 5 days.  For acute pain take tizanidine 4 mg every 6 hours needed for muscle pain.  I provided you with a work note in which you are written out for 2 days and may return on 04/28/2020 or your next scheduled workday after.    ED Prescriptions    Medication Sig Dispense Auth. Provider   tiZANidine (ZANAFLEX) 4 MG tablet Take 1 tablet (4 mg total) by mouth every 6 (six) hours as needed for muscle  spasms. 30 tablet Bing Neighbors, FNP   predniSONE (DELTASONE) 20 MG tablet Take 2 tablets (40 mg total) by mouth daily with breakfast. 10 tablet Bing Neighbors, FNP     PDMP not reviewed this encounter.   Bing Neighbors, FNP 04/26/20 [phone]

## 2020-04-26 NOTE — Discharge Instructions (Signed)
Your x-ray is negative for any fracture or acute injury. Treating for chronic back flare with prednisone 40 mg once daily for 5 days.  For acute pain take tizanidine 4 mg every 6 hours needed for muscle pain.  I provided you with a work note in which you are written out for 2 days and may return on 04/28/2020 or your next scheduled workday after.

## 2020-06-06 ENCOUNTER — Encounter: Payer: Self-pay | Admitting: Emergency Medicine

## 2020-06-06 ENCOUNTER — Other Ambulatory Visit: Payer: Self-pay

## 2020-06-06 ENCOUNTER — Ambulatory Visit
Admission: EM | Admit: 2020-06-06 | Discharge: 2020-06-06 | Disposition: A | Discharge status: Home / Self Care | Payer: BLUE CROSS/BLUE SHIELD | Attending: Emergency Medicine | Admitting: Emergency Medicine

## 2020-06-06 DIAGNOSIS — R519 Headache, unspecified: Secondary | ICD-10-CM | POA: Diagnosis not present

## 2020-06-06 DIAGNOSIS — T7840XA Allergy, unspecified, initial encounter: Secondary | ICD-10-CM

## 2020-06-06 DIAGNOSIS — J0140 Acute pansinusitis, unspecified: Secondary | ICD-10-CM | POA: Diagnosis not present

## 2020-06-06 MED ORDER — DEXAMETHASONE SODIUM PHOSPHATE 10 MG/ML IJ SOLN
10.0000 mg | Freq: Once | INTRAMUSCULAR | Status: AC
  Administered 2020-06-06: 10 mg via INTRAMUSCULAR

## 2020-06-06 MED ORDER — IBUPROFEN 600 MG PO TABS: 600.0000 mg | ORAL_TABLET | Freq: Four times a day (QID) | ORAL | 0 refills | Status: DC | PRN

## 2020-06-06 MED ORDER — FLUTICASONE PROPIONATE 50 MCG/ACT NA SUSP: 2.0000 | Freq: Every day | NASAL | 0 refills | Status: DC

## 2020-06-06 MED ORDER — TIZANIDINE HCL 4 MG PO TABS: 4.0000 mg | ORAL_TABLET | Freq: Three times a day (TID) | ORAL | 0 refills | Status: DC | PRN

## 2020-06-06 MED ORDER — AMOXICILLIN-POT CLAVULANATE 875-125 MG PO TABS: 1.0000 | ORAL_TABLET | Freq: Two times a day (BID) | ORAL | 0 refills | Status: DC

## 2020-06-06 NOTE — ED Triage Notes (Signed)
Pt sts HA into right side of face x 1 week since increased exposure to pollen at work

## 2020-06-06 NOTE — ED Provider Notes (Signed)
HPI  SUBJECTIVE:  Anthony Silva is a 35 y.o. male who reports 1 week of recurrent, sudden onset right-sided headaches that last for hours.  They are not worst at onset.  He is not exerting himself when these headaches occur.  He describes them as severe, throbbing, lasting for hours.  He states that the pain radiates into his ear, right-sided upper and lower teeth.  He reports photophobia, phonophobia.  He also reports yellowish nasal congestion, and allergy symptoms of itchy, watery eyes, sneezing.  He is exposed to a lot of pollen recently at work.  No nausea, vomiting, blurry vision, double vision, neck stiffness, fevers, body aches, sore throat, loss of sense of taste or smell, nausea, vomiting, diarrhea, abdominal pain, facial droop, arm or leg weakness, slurred speech, discoordination, syncope, seizures.  He also reports a cough and occasional wheezing.  He has been smoking marijuana, taking ibuprofen 200 mg, Tylenol 650 mg, taking decongestants, Claritin and Zyrtec without improvement in his symptoms.  Symptoms worse with light, noise.  He denies any cocaine use in the past 3 to 4 weeks.  He has a past medical history of allergies, asthma, frequent sinusitis, cocaine and marijuana use.  Denies other illicits.  No history of migraines, aneurysm, atrial fibrillation, cluster headaches, stroke, diabetes, hypertension, glaucoma, temporal arteritis.  Family history positive for grandmother with a stroke when she was older, no history of aneurysm, migraines, cluster headaches  PMD: None.   Past Medical History:  Diagnosis Date  . Allergy     Past Surgical History:  Procedure Laterality Date  . tooth removal      Family History  Problem Relation Age of Onset  . Diabetes Father   . Diabetes Sister     Social History   Tobacco Use  . Smoking status: Current Every Day Smoker  . Smokeless tobacco: Never Used  Substance Use Topics  . Alcohol use: Yes  . Drug use: Yes    Types:  Marijuana, Cocaine    No current facility-administered medications for this encounter.  Current Outpatient Medications:  .  amoxicillin-clavulanate (AUGMENTIN) 875-125 MG tablet, Take 1 tablet by mouth 2 (two) times daily. X 7 days, Disp: 14 tablet, Rfl: 0 .  fluticasone (FLONASE) 50 MCG/ACT nasal spray, Place 2 sprays into both nostrils daily., Disp: 16 g, Rfl: 0 .  ibuprofen (ADVIL) 600 MG tablet, Take 1 tablet (600 mg total) by mouth every 6 (six) hours as needed., Disp: 30 tablet, Rfl: 0 .  tiZANidine (ZANAFLEX) 4 MG tablet, Take 1 tablet (4 mg total) by mouth every 8 (eight) hours as needed for muscle spasms., Disp: 30 tablet, Rfl: 0 .  albuterol (PROVENTIL HFA;VENTOLIN HFA) 108 (90 Base) MCG/ACT inhaler, Inhale 2 puffs into the lungs every 4 (four) hours as needed for wheezing or shortness of breath (cough, shortness of breath or wheezing.)., Disp: 1 Inhaler, Rfl: 1  No Known Allergies   ROS  As noted in HPI.   Physical Exam  BP 134/88 (BP Location: Left Arm)   Pulse (!) 105   Temp 98.2 F (36.8 C) (Oral)   Resp 18   SpO2 98%   Constitutional: Well developed, well nourished, no acute distress Eyes: PERRL, EOMI, conjunctiva normal bilaterally.  Bilateral photophobia worse on the right. HENT: Normocephalic, atraumatic,mucus membranes moist, normal dentition.  TM normal b/l. No TMJ tenderness.  Positive purulent nasal congestion, erythematous, swollen turbinates worse on the right.  Positive right-sided sinus tenderness.  Positive cobblestoning.  No temporal artery  tenderness.  Neck: no cervical LN + bilateral trapezial muscle tenderness. No meningismus Respiratory: normal inspiratory effort Cardiovascular: Normal rate, regular rhythm GI:  nondistended skin: No rash, skin intact Musculoskeletal: No edema, no tenderness, no deformities Neurologic: Alert & oriented x 3, CN III-XII intact, romberg neg, finger-> nose, heel-> shin equal b/l, Romberg neg, tandem gait  steady Psychiatric: Speech and behavior appropriate   ED Course   Medications  dexamethasone (DECADRON) injection 10 mg (10 mg Intramuscular Given 06/06/20 2050)    Orders Placed This Encounter  Procedures  . Ambulatory referral to Neurology    Referral Priority:   Routine    Referral Type:   Consultation    Referral Reason:   Specialty Services Required    Requested Specialty:   Neurology    Number of Visits Requested:   1   No results found for this or any previous visit (from the past 24 hour(s)). No results found.   ED Clinical Impression  1. Acute non-recurrent pansinusitis   2. Allergy, initial encounter   3. Acute nonintractable headache, unspecified headache type     ED Assessment/Plan  Pt describing sudden onset, but not maximal at onset. Doubt SAH, ICH or space occupying lesion. Pt without fevers/chills, Pt has no meningeal sx, no nuchal rigidity. Doubt meningitis. Pt with normal neuro exam, no evidence of CVA/TIA.  Pt BP not elevated significantly, doubt hypertensive emergency. No evidence of temporal artery tenderness, no evidence of glaucoma or other ocular pathology.   Pt meets [facility] criteria (age greater than or equal to 40, no neck pain or stiffness, no witnessed LOC, no onset during exertion,  no maximum severity within 1 second and no resistance to neck flexion on exam).   Patient with a recurrent headache, most likely multifactorial.  Does not appear to be a stroke, cluster headache, aneurysm or other neurologic emergency.  Feel there is a component of allergic sinusitis, so will treat with Augmentin, Flonase, saline nasal irrigation, Allegra-D.  I also think that there is a musculoskeletal component, so will send home with Zanaflex in addition to Tylenol and ibuprofen.  He may be having migraines triggered by these 2 things, so will give a shot of dexamethasone here, and will place a referral to neurology.  Will provide primary care list and order  assistance in finding a PMD.  ER return precautions given.   Discussed  MDM, plan for follow up, signs and sx that should prompt return to ER. Pt agrees with plan  Meds ordered this encounter  Medications  . dexamethasone (DECADRON) injection 10 mg  . ibuprofen (ADVIL) 600 MG tablet    Sig: Take 1 tablet (600 mg total) by mouth every 6 (six) hours as needed.    Dispense:  30 tablet    Refill:  0  . tiZANidine (ZANAFLEX) 4 MG tablet    Sig: Take 1 tablet (4 mg total) by mouth every 8 (eight) hours as needed for muscle spasms.    Dispense:  30 tablet    Refill:  0  . fluticasone (FLONASE) 50 MCG/ACT nasal spray    Sig: Place 2 sprays into both nostrils daily.    Dispense:  16 g    Refill:  0  . amoxicillin-clavulanate (AUGMENTIN) 875-125 MG tablet    Sig: Take 1 tablet by mouth 2 (two) times daily. X 7 days    Dispense:  14 tablet    Refill:  0    *This clinic note was created using Dragon  dictation software. Therefore, there may be occasional mistakes despite careful proofreading.  ?    Domenick Gong, MD 06/07/20 [phone]

## 2020-06-06 NOTE — Discharge Instructions (Addendum)
Finish the Augmentin, even if you feel better.  Flonase, saline nasal irrigation with a [facility] Med rinse and distilled water as often as you want, Allegra-D.  Ibuprofen 600 mg, 1000 mg of Tylenol 3-4 times a day as needed for pain.  Zanaflex for muscle spasms.  Please follow-up with [place] neurology as soon as she can.  Below is a list of primary care practices who are taking new patients for you to follow-up with.  [facility] internal medicine clinic Ground Floor - [facility], [address] [place], [place], [place] [address] [phone]  [facility] at [facility] [address] [place], [place] [address] [phone]  [facility] [address] [place], [place] [address] [phone]  [facility] [phone] [address] [place] [address]  [facility]: [address] [place] [address]  [phone]  [facility]: [address] [place] [address]   [phone]  [facility]: [address] [place] [address]  [phone]  [facility] : [address] [place] [address] [phone]  [facility]: [address] [place] [address] [phone]  [facility]: [address] [place] [address] [phone]  Dr. Oneal Grout [address] [facility] [place] [address]  [phone]  Go to www.goodrx.com  or www.costplusdrugs.com to look up your medications. This will give you a list of where you can find your prescriptions at the most affordable prices. Or ask the pharmacist what the cash price is, or if they have any other discount programs available to help make your medication more affordable. This can be less  expensive than what you would pay with insurance.

## 2020-07-06 ENCOUNTER — Other Ambulatory Visit: Payer: Self-pay

## 2020-07-06 ENCOUNTER — Ambulatory Visit
Admission: EM | Admit: 2020-07-06 | Discharge: 2020-07-06 | Disposition: A | Discharge status: Home / Self Care | Payer: BLUE CROSS/BLUE SHIELD | Attending: Emergency Medicine | Admitting: Emergency Medicine

## 2020-07-06 DIAGNOSIS — M546 Pain in thoracic spine: Secondary | ICD-10-CM | POA: Diagnosis not present

## 2020-07-06 MED ORDER — TIZANIDINE HCL 2 MG PO TABS: 2.0000 mg | ORAL_TABLET | Freq: Four times a day (QID) | ORAL | 0 refills | Status: DC | PRN

## 2020-07-06 MED ORDER — NAPROXEN 500 MG PO TABS: 500.0000 mg | ORAL_TABLET | Freq: Two times a day (BID) | ORAL | 0 refills | Status: DC

## 2020-07-06 NOTE — ED Triage Notes (Signed)
One week h/o stiffness, muscle tightness, soreness in his low back after tripping backwards at work. Has been taking ibuprofen without relief. Denies leg pain, numbness and tingling. Pain is interrupting his sleep. Pt stands up a lot for work. No falls reported.

## 2020-07-06 NOTE — Discharge Instructions (Addendum)
Naprosyn twice daily with food Supplement tizanidine at home/bedtime-will cause drowsiness, do not drive or work after taking Alternate ice and heat Gentle stretching Follow-up if not improving or worsening
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
## 2020-10-01 ENCOUNTER — Emergency Department (HOSPITAL_COMMUNITY): Payer: No Typology Code available for payment source

## 2020-10-01 ENCOUNTER — Emergency Department (HOSPITAL_COMMUNITY)
Admission: EM | Admit: 2020-10-01 | Discharge: 2020-10-01 | Disposition: A | Discharge status: Home / Self Care | Payer: No Typology Code available for payment source | Attending: Physician Assistant | Admitting: Physician Assistant

## 2020-10-01 ENCOUNTER — Encounter (HOSPITAL_COMMUNITY): Payer: Self-pay | Admitting: Emergency Medicine

## 2020-10-01 DIAGNOSIS — Y92410 Unspecified street and highway as the place of occurrence of the external cause: Secondary | ICD-10-CM | POA: Insufficient documentation

## 2020-10-01 DIAGNOSIS — M25552 Pain in left hip: Secondary | ICD-10-CM | POA: Insufficient documentation

## 2020-10-01 DIAGNOSIS — M546 Pain in thoracic spine: Secondary | ICD-10-CM | POA: Insufficient documentation

## 2020-10-01 DIAGNOSIS — Z5321 Procedure and treatment not carried out due to patient leaving prior to being seen by health care provider: Secondary | ICD-10-CM | POA: Insufficient documentation

## 2020-10-01 DIAGNOSIS — M542 Cervicalgia: Secondary | ICD-10-CM | POA: Diagnosis not present

## 2020-10-01 DIAGNOSIS — M79642 Pain in left hand: Secondary | ICD-10-CM | POA: Diagnosis not present

## 2020-10-01 DIAGNOSIS — M25512 Pain in left shoulder: Secondary | ICD-10-CM | POA: Insufficient documentation
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
## 2020-10-03 ENCOUNTER — Ambulatory Visit (HOSPITAL_COMMUNITY)
Admission: RE | Admit: 2020-10-03 | Discharge: 2020-10-03 | Disposition: A | Discharge status: Home / Self Care | Payer: Self-pay | Source: Ambulatory Visit | Attending: Family Medicine | Admitting: Family Medicine

## 2020-10-03 ENCOUNTER — Encounter (HOSPITAL_COMMUNITY): Payer: Self-pay

## 2020-10-03 ENCOUNTER — Other Ambulatory Visit: Payer: Self-pay

## 2020-10-03 VITALS — BP 131/78 | HR 63 | Temp 98.2°F | Resp 18

## 2020-10-03 DIAGNOSIS — M62830 Muscle spasm of back: Secondary | ICD-10-CM

## 2020-10-03 MED ORDER — DICLOFENAC SODIUM 75 MG PO TBEC: 75.0000 mg | DELAYED_RELEASE_TABLET | Freq: Two times a day (BID) | ORAL | 0 refills | Status: DC

## 2020-10-03 MED ORDER — CYCLOBENZAPRINE HCL 10 MG PO TABS: ORAL_TABLET | ORAL | 0 refills | Status: DC
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
## 2020-10-10 ENCOUNTER — Encounter: Payer: Self-pay | Admitting: *Deleted

## 2021-07-19 ENCOUNTER — Ambulatory Visit
Admission: EM | Admit: 2021-07-19 | Discharge: 2021-07-19 | Disposition: A | Discharge status: Home / Self Care | Payer: 59 | Attending: Internal Medicine | Admitting: Internal Medicine

## 2021-07-19 DIAGNOSIS — S56419A Strain of extensor muscle, fascia and tendon of finger, unspecified finger at forearm level, initial encounter: Secondary | ICD-10-CM | POA: Diagnosis not present

## 2021-07-19 MED ORDER — IBUPROFEN 600 MG PO TABS: 600.0000 mg | ORAL_TABLET | Freq: Four times a day (QID) | ORAL | 0 refills | Status: DC | PRN

## 2021-07-19 NOTE — Discharge Instructions (Addendum)
Rest and elevate the affected painful area.   Apply cold compresses intermittently as needed.   As pain recedes, begin normal activities slowly as tolerated. Wrist brace will help with pain. Return to urgent care if symptoms persist.

## 2021-07-19 NOTE — ED Triage Notes (Signed)
Pt presents with right middle finger injury after playing with his son.

## 2021-07-19 NOTE — ED Provider Notes (Signed)
Anthony Silva    CSN: [id number] Arrival date & time: 07/19/21  1245      History   Chief Complaint Chief Complaint  Patient presents with   Finger Injury    HPI VIHAAN Silva is a 36 y.o. male comes to urgent Silva with pain on the dorsum of the right hand.  Patient was playing with his son when he sustained an injury to the right hand.  Following that patient experienced severe pain over the knuckle of the middle finger.  Pain is sharp, throbbing and aggravated by palpation and flexing the second metacarpophalangeal joint of the right hand.  No swelling or deformity.  No bruising noted.   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 Authorizing Provider  ibuprofen (ADVIL) 600 MG tablet Take 1 tablet (600 mg total) by mouth every 6 (six) hours as needed. 07/19/21  Yes Krystelle Prashad, Britta Mccreedy, MD  albuterol (PROVENTIL HFA;VENTOLIN HFA) 108 (90 Base) MCG/ACT inhaler Inhale 2 puffs into the lungs every 4 (four) hours as needed for wheezing or shortness of breath (cough, shortness of breath or wheezing.). 01/25/18   Elvina Sidle, MD  cyclobenzaprine (FLEXERIL) 10 MG tablet Take 1 tablet by mouth 3 times daily as needed for muscle spasm. Warning: May cause drowsiness. 10/03/20   Mardella Layman, MD  diclofenac (VOLTAREN) 75 MG EC tablet Take 1 tablet (75 mg total) by mouth 2 (two) times daily. 10/03/20   Mardella Layman, MD  fluticasone (FLONASE) 50 MCG/ACT nasal spray Place 2 sprays into both nostrils daily. 06/06/20   Domenick Gong, MD    Family History Family History  Problem Relation Age of Onset   Diabetes Father    Diabetes Sister      Social History Social History   Tobacco Use   Smoking status: Some Days    Types: Cigarettes   Smokeless tobacco: Never  Vaping Use   Vaping Use: Some days  Substance Use Topics   Alcohol use: Yes   Drug use: Yes    Types: Marijuana     Allergies   Patient has no known allergies.   Review of Systems Review of Systems  Musculoskeletal:  Positive for arthralgias. Negative for joint swelling and myalgias.     Physical Exam Triage Vital Signs ED Triage Vitals  Enc Vitals Group     BP 07/19/21 1314 133/84     Pulse Rate 07/19/21 1314 (!) 55     Resp 07/19/21 1314 18     Temp 07/19/21 1314 98.2 F (36.8 C)     Temp Source 07/19/21 1314 Oral     SpO2 07/19/21 1314 98 %     Weight --      Height --      Head Circumference --      Peak Flow --      Pain Score 07/19/21 1313 9     Pain Loc --      Pain Edu? --      Excl. in [place]? --    No data found.  Updated Vital Signs BP 133/84 (BP Location: Left Arm)  Pulse (!) 55   Temp 98.2 F (36.8 C) (Oral)   Resp 18   SpO2 98%   Visual Acuity Right Eye Distance:   Left Eye Distance:   Bilateral Distance:    Right Eye Near:   Left Eye Near:    Bilateral Near:     Physical Exam Vitals and nursing note reviewed.  Constitutional:      General: He is not in acute distress.    Appearance: He is not ill-appearing.  Musculoskeletal:        General: Tenderness present. No swelling. Normal range of motion.     Comments: Tenderness over the metacarpophalangeal joint of the right middle finger.  The extensor tendon is tender to palpation.  Neurological:     Mental Status: He is alert.      UC Treatments / Results  Labs (all labs ordered are listed, but only abnormal results are displayed) Labs Reviewed - No data to display  EKG   Radiology No results found.  Procedures Procedures (including critical Silva time)  Medications Ordered in UC Medications - No data to display  Initial Impression /  Assessment and Plan / UC Course  I have reviewed the triage vital signs and the nursing notes.  Pertinent labs & imaging results that were available during my Silva of the patient were reviewed by me and considered in my medical decision making (see chart for details).     1.  Strain of the extensor digitorum tendon at the level of the metacarpophalangeal joint: Icing of the right hand NSAIDs as prescribed Gentle stretching exercises as pain subsides Return to urgent Silva if symptoms worsen. Final Clinical Impressions(s) / UC Diagnoses   Final diagnoses:  Strain of extensor digitorum tendon     Discharge Instructions      Rest and elevate the affected painful area.   Apply cold compresses intermittently as needed.   As pain recedes, begin normal activities slowly as tolerated. Wrist brace will help with pain. Return to urgent Silva if symptoms persist.    ED Prescriptions     Medication Sig Dispense Auth. Provider   ibuprofen (ADVIL) 600 MG tablet Take 1 tablet (600 mg total) by mouth every 6 (six) hours as needed. 30 tablet Paulette Lynch, Britta Mccreedy, MD      PDMP not reviewed this encounter.   Merrilee Jansky, MD 07/19/21 [phone]

## 2021-12-15 ENCOUNTER — Telehealth (HOSPITAL_COMMUNITY): Payer: Self-pay | Admitting: Family Medicine

## 2021-12-15 ENCOUNTER — Encounter (HOSPITAL_COMMUNITY): Payer: Self-pay

## 2021-12-15 ENCOUNTER — Ambulatory Visit (HOSPITAL_COMMUNITY)
Admission: EM | Admit: 2021-12-15 | Discharge: 2021-12-15 | Disposition: A | Discharge status: Home / Self Care | Payer: 59 | Attending: Family Medicine | Admitting: Family Medicine

## 2021-12-15 DIAGNOSIS — M79641 Pain in right hand: Secondary | ICD-10-CM | POA: Diagnosis not present

## 2021-12-15 DIAGNOSIS — J069 Acute upper respiratory infection, unspecified: Secondary | ICD-10-CM | POA: Insufficient documentation

## 2021-12-15 DIAGNOSIS — M25512 Pain in left shoulder: Secondary | ICD-10-CM | POA: Diagnosis present

## 2021-12-15 DIAGNOSIS — Z1152 Encounter for screening for COVID-19: Secondary | ICD-10-CM | POA: Diagnosis not present

## 2021-12-15 LAB — RESP PANEL BY RT-PCR (FLU A&B, COVID) ARPGX2
Influenza A by PCR: POSITIVE — AB
Influenza B by PCR: NEGATIVE
SARS Coronavirus 2 by RT PCR: NEGATIVE

## 2021-12-15 MED ORDER — BENZONATATE 100 MG PO CAPS: 100.0000 mg | ORAL_CAPSULE | Freq: Three times a day (TID) | ORAL | 0 refills | Status: DC | PRN

## 2021-12-15 MED ORDER — OSELTAMIVIR PHOSPHATE 75 MG PO CAPS: 75.0000 mg | ORAL_CAPSULE | Freq: Two times a day (BID) | ORAL | 0 refills | Status: DC
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
## 2021-12-15 NOTE — Telephone Encounter (Signed)
Flu test is positive.  [facility]iflu [facility].   Voicemail left on patient's cell phone stating that his test was positive then have sent in a prescription, and that is best to start it

## 2021-12-15 NOTE — ED Triage Notes (Signed)
Pt has a sore throat, chest congestion, nasal congestion, runny nose , cough , fatigue, chills, and sore throat x4dayds, follow up right hand pain that, has not better. Left shoulder pain x1 month .

## 2021-12-15 NOTE — Discharge Instructions (Signed)
Take benzonatate 100 mg, 1 tab every 8 hours as needed for cough.  Tylenol or ibuprofen over-the-counter as needed for fever and chills   You have been swabbed for COVID and flu, and the test will result in the next 24 hours. Our staff will call you if positive. If the COVID test is positive, you should quarantine for 5 days from the start of your symptoms

## 2022-01-02 DIAGNOSIS — M7541 Impingement syndrome of right shoulder: Secondary | ICD-10-CM | POA: Insufficient documentation

## 2022-01-30 DIAGNOSIS — M79641 Pain in right hand: Secondary | ICD-10-CM | POA: Insufficient documentation

## 2022-01-30 DIAGNOSIS — M25512 Pain in left shoulder: Secondary | ICD-10-CM | POA: Insufficient documentation
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
## 2022-04-21 ENCOUNTER — Ambulatory Visit: Payer: 59 | Admitting: Student

## 2022-04-21 ENCOUNTER — Ambulatory Visit
Admission: EM | Admit: 2022-04-21 | Discharge: 2022-04-21 | Disposition: A | Discharge status: Home / Self Care | Payer: 59 | Attending: Internal Medicine | Admitting: Internal Medicine

## 2022-04-21 ENCOUNTER — Telehealth: Payer: Self-pay | Admitting: *Deleted

## 2022-04-21 DIAGNOSIS — R1030 Lower abdominal pain, unspecified: Secondary | ICD-10-CM

## 2022-04-21 DIAGNOSIS — Z113 Encounter for screening for infections with a predominantly sexual mode of transmission: Secondary | ICD-10-CM | POA: Diagnosis not present

## 2022-04-21 NOTE — ED Triage Notes (Signed)
Pt c/o groin tightness requesting sti screening onset ~ 2 days ago. Denies dysuria, discharge, skin lesions.

## 2022-04-21 NOTE — Telephone Encounter (Signed)
Call from pt who stated he missed his appt this am. New pt to our office. He stated he went to the wrong office. Stated he has relapsed on ETOH and cocaine and needs to be seen. No available appts until next week; front office had rescheduled his appt for 4/22. I asked if he has been to behavior health; stated he recently got out. I advised pt to go the ER or call [facility] crisis line (stated he does  have their telephone#) - stated he will call.

## 2022-04-21 NOTE — Discharge Instructions (Signed)
Your penile swab is pending.  Will call if it is abnormal and treat as appropriate.  Please refrain from sexual activity until test results and treatment are complete.

## 2022-04-21 NOTE — ED Provider Notes (Signed)
[facility]    CSN: [id number] Arrival date & time: 04/21/22  1239      History   Chief Complaint Chief Complaint  Patient presents with   sti screening    HPI Anthony Silva is a 37 y.o. male.   Patient presents for STD testing.  Patient reports that he is having some bilateral lower abdominal pain which feels typical from when he had STD previously. He denies penile discharge, dysuria, urinary frequency, testicular pain or swelling, back pain, fever.  Patient denies nausea, vomiting, diarrhea, constipation and is having normal bowel movements.  Patient denies any exposure to STD but has had unprotected intercourse with a single male partner a few days prior.     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 Authorizing Provider  albuterol (PROVENTIL HFA;VENTOLIN HFA) 108 (90 Base) MCG/ACT inhaler Inhale 2 puffs into the lungs every 4 (four) hours as needed for wheezing or shortness of breath (cough, shortness of breath or wheezing.). 01/25/18   Elvina Sidle, MD  benzonatate (TESSALON) 100 MG capsule Take 1 capsule (100 mg total) by mouth 3 (three) times daily as needed for cough. 12/15/21   Zenia Resides, MD  fluticasone (FLONASE) 50 MCG/ACT nasal spray Place 2 sprays into both nostrils daily. 06/06/20   Domenick Gong, MD  oseltamivir (TAMIFLU) 75 MG capsule Take 1 capsule (75 mg total) by mouth every 12 (twelve) hours. 12/15/21   Zenia Resides, MD    Family History Family History  Problem Relation Age of Onset   Diabetes Father    Diabetes Sister     Social History Social History   Tobacco Use   Smoking status:  Some Days    Types: Cigarettes   Smokeless tobacco: Never  Vaping Use   Vaping Use: Some days  Substance Use Topics   Alcohol use: Yes   Drug use: Yes    Types: Marijuana     Allergies   Patient has no known allergies.   Review of Systems Review of Systems Per HPI  Physical Exam Triage Vital Signs ED Triage Vitals  Enc Vitals Group     BP 04/21/22 1446 126/86     Pulse Rate 04/21/22 1446 63     Resp 04/21/22 1446 18     Temp 04/21/22 1446 97.9 F (36.6 C)     Temp Source 04/21/22 1446 Oral     SpO2 04/21/22 1446 96 %     Weight --      Height --      Head Circumference --      Peak Flow --      Pain Score 04/21/22 1450 3     Pain Loc --      Pain Edu? --      Excl. in GC? --    No data found.  Updated Vital Signs BP 126/86 (BP Location: Left Arm)   Pulse 63   Temp 97.9 F (36.6 C) (Oral)   Resp 18   SpO2 96%   Visual Acuity Right Eye Distance:   Left Eye Distance:   Bilateral Distance:  Right Eye Near:   Left Eye Near:    Bilateral Near:     Physical Exam Constitutional:      General: He is not in acute distress.    Appearance: Normal appearance. He is not toxic-appearing or diaphoretic.  HENT:     Head: Normocephalic and atraumatic.  Eyes:     Extraocular Movements: Extraocular movements intact.     Conjunctiva/sclera: Conjunctivae normal.  Pulmonary:     Effort: Pulmonary effort is normal.  Genitourinary:    Comments: Deferred with shared decision making. Self swab performed.  Neurological:     General: No focal deficit present.     Mental Status: He is alert and oriented to person, place, and time. Mental status is at baseline.  Psychiatric:        Mood and Affect: Mood normal.        Behavior: Behavior normal.        Thought Content: Thought content normal.        Judgment: Judgment normal.      UC Treatments / Results  Labs (all labs ordered are listed, but only abnormal results are displayed) Labs Reviewed  CYTOLOGY,  (ORAL, ANAL, URETHRAL) ANCILLARY ONLY    EKG   Radiology No results found.  Procedures Procedures (including critical care time)  Medications Ordered in UC Medications - No data to display  Initial Impression / Assessment and Plan / UC Course  I have reviewed the triage vital signs and the nursing notes.  Pertinent labs & imaging results that were available during my care of the patient were reviewed by me and considered in my medical decision making (see chart for details).     Patient presenting with lower abdominal pain and reports this feels typical for when he has had an STD previously.  Therefore, cytology swab is pending.  Given no confirmed exposure to STD, will await result for further treatment.  Patient is not having gastrointestinal symptoms which is reassuring.  Patient declined blood work for HIV and syphilis.  Advised strict return precautions.  Advised to refrain from sexual activity until test results and treatment are complete.  Patient verbalized understanding and was agreeable with plan. Final Clinical Impressions(s) / UC Diagnoses   Final diagnoses:  Lower abdominal pain  Screening examination for venereal disease     Discharge Instructions      Your penile swab is pending.  Will call if it is abnormal and treat as appropriate.  Please refrain from sexual activity until test results and treatment are complete.    ED Prescriptions   None    PDMP not reviewed this encounter.   Gustavus Bryant, [place] 04/21/22 1505

## 2022-04-23 LAB — CYTOLOGY, (ORAL, ANAL, URETHRAL) ANCILLARY ONLY
Chlamydia: NEGATIVE
Comment: NEGATIVE
Comment: NEGATIVE
Comment: NORMAL
Neisseria Gonorrhea: NEGATIVE
Trichomonas: NEGATIVE

## 2022-05-05 ENCOUNTER — Ambulatory Visit: Payer: 59 | Admitting: Student

## 2022-05-09 ENCOUNTER — Ambulatory Visit: Payer: 59 | Admitting: Internal Medicine

## 2022-05-09 NOTE — Progress Notes (Deleted)
   CC: ***  HPI:Anthony Silva is a 37 y.o. male who presents for evaluation of ***. Please see individual problem based A/P for details.  36 yom hx of cocaine abuse and cocaine induced psychosis, high risk sexual behavior Needs PCP  Assessment: ***  Plan: ***  ***  Assessment: ***  Plan: ***  ***  Assessment: ***  Plan: ***   Depression, PHQ-9: Based on the patients  score we have ***.  Past Medical History:  Diagnosis Date   Allergy    Review of Systems:   ROS   Physical Exam: There were no vitals filed for this visit.   General: *** HEENT: Conjunctiva nl , antiicteric sclerae, moist mucous membranes, no exudate or erythema Cardiovascular: Normal rate, regular rhythm.  No murmurs, rubs, or gallops Pulmonary : Equal breath sounds, No wheezes, rales, or rhonchi Abdominal: soft, nontender,  bowel sounds present Ext: No edema in lower extremities, no tenderness to palpation of lower extremities.   Assessment & Plan:   See Encounters Tab for problem based charting.  Patient {GC/GE:[id number]::"discussed with","seen with"} Dr. {ZOXWR:[id number]::"W. Hoffman","Guilloud","Mullen","Narendra","Raines","Vincent","Williams"}

## 2022-06-05 ENCOUNTER — Encounter: Payer: Self-pay | Admitting: Student

## 2022-06-05 ENCOUNTER — Other Ambulatory Visit: Payer: Self-pay

## 2022-06-05 ENCOUNTER — Ambulatory Visit (INDEPENDENT_AMBULATORY_CARE_PROVIDER_SITE_OTHER): Payer: 59 | Admitting: Student

## 2022-06-05 VITALS — BP 95/56 | HR 60 | Temp 97.9°F | Ht 76.0 in | Wt 203.4 lb

## 2022-06-05 DIAGNOSIS — F1011 Alcohol abuse, in remission: Secondary | ICD-10-CM | POA: Diagnosis not present

## 2022-06-05 DIAGNOSIS — M545 Low back pain, unspecified: Secondary | ICD-10-CM | POA: Diagnosis not present

## 2022-06-05 DIAGNOSIS — F32A Depression, unspecified: Secondary | ICD-10-CM

## 2022-06-05 DIAGNOSIS — G8929 Other chronic pain: Secondary | ICD-10-CM

## 2022-06-05 DIAGNOSIS — F14151 Cocaine abuse with cocaine-induced psychotic disorder with hallucinations: Secondary | ICD-10-CM | POA: Diagnosis not present

## 2022-06-05 DIAGNOSIS — F1010 Alcohol abuse, uncomplicated: Secondary | ICD-10-CM

## 2022-06-05 MED ORDER — MELOXICAM 15 MG PO TABS: 15.0000 mg | ORAL_TABLET | Freq: Every day | ORAL | 1 refills | Status: AC
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
## 2022-06-05 NOTE — Patient Instructions (Signed)
It was a pleasure seeing you in clinic today  I have made a referral to psychiatry to manage your medications  For you back pain please take tylenol 500mg  every 8 hours as needed and meloxicam as needed for pain Physical therapy may help with the pain as well, I have made a referral and you should be called to schedule this  Please call us if you are having significant craving to alcohol as there are medication to help with this  Please continue to refrain from cocaine and alcohol use, you are doing a great job with this  Follow up in 3 months

## 2022-06-06 LAB — CMP14 + ANION GAP
ALT: 28 IU/L (ref 0–44)
AST: 17 IU/L (ref 0–40)
Albumin/Globulin Ratio: 1.6 (ref 1.2–2.2)
Albumin: 4.4 g/dL (ref 4.1–5.1)
Alkaline Phosphatase: 49 IU/L (ref 44–121)
Anion Gap: 12 mmol/L (ref 10.0–18.0)
BUN/Creatinine Ratio: 12 (ref 9–20)
BUN: 14 mg/dL (ref 6–20)
Bilirubin Total: 0.3 mg/dL (ref 0.0–1.2)
CO2: 25 mmol/L (ref 20–29)
Calcium: 9.9 mg/dL (ref 8.7–10.2)
Chloride: 101 mmol/L (ref 96–106)
Creatinine, Ser: 1.16 mg/dL (ref 0.76–1.27)
Globulin, Total: 2.7 g/dL (ref 1.5–4.5)
Glucose: 95 mg/dL (ref 70–99)
Potassium: 4.3 mmol/L (ref 3.5–5.2)
Sodium: 138 mmol/L (ref 134–144)
Total Protein: 7.1 g/dL (ref 6.0–8.5)
eGFR: 85 mL/min/{1.73_m2} (ref >59)
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
## 2022-06-06 NOTE — Assessment & Plan Note (Addendum)
Reports psychotic episode associated with cocaine and alcohol use.  Currently taking Zoloft 50 mg daily, aripiprazole 2 mg daily, quetiapine 50 mg as needed for sleep.  Was seeing [facility] with [facility] while at substance use rehabilitation but does not have a regular psychiatrist.  No further AVD off substances.  Has not taken cocaine or alcohol in approximately 43 days.  Does nicotine vape and smokes marijuana daily. Denies history of injection drug use. Does frequently have cravings.  Also has a sponsor when this happens.  No SI or HI.  He reports that he had been told he has features of both bipolar or schizophrenia but was never diagnosed.  Unfortunately no treatment for cocaine use. Encouraged continued abstinence. Continue to follow up with counselor.  Will refer him to psychiatry for further evaluation.

## 2022-06-06 NOTE — Assessment & Plan Note (Signed)
Currently taking Zoloft 50 mg daily and aripiprazole.  PHQ-9 of 3.  Denies any current depression or mood symptoms.  He also takes quetiapine 50 mg as needed for sleep.  Feels very groggy in the morning with this medication.  Not having much trouble with sleeping currently. He feels melatonin before bedtime makes him less groggy.   Continue Zoloft and aripiprazole Seroquel as needed for sleep or take melatonin instead Referral to psychiatry

## 2022-06-06 NOTE — Assessment & Plan Note (Addendum)
History of heavy alcohol drinking on the weekends associated with cocaine use as well.  Estimates he would drink a fifth of liquor daily on Saturdays and days and Sundays.  Recently completed inpatient and outpatient intensive rehab patient at [facility]. Now abstaining from alcohol for about 1 month.  Currently going to [facility]  Seeing a counselor and psychiatrist through rehab program.  Has continued follow-up with counselor but needs a new psychiatrist.  Reports he has frequent cravings for cocaine but not as much for alcohol.  Has not tried naltrexone due to concern for side effects.  Also discussed acamprosate as well.  Overall he feels he is able to control his alcohol use.  CMP today Encourage continued abstinence from alcohol Continue with counseling Monitor for alcohol cravings and desire for medical treatment for this

## 2022-06-06 NOTE — Progress Notes (Signed)
Internal Medicine Clinic Attending ? ?Case discussed with Dr. Liang  At the time of the visit.  We reviewed the resident?s history and exam and pertinent patient test results.  I agree with the assessment, diagnosis, and plan of care documented in the resident?s note. ? ?

## 2022-06-10 ENCOUNTER — Telehealth: Payer: Self-pay

## 2022-06-10 NOTE — Telephone Encounter (Signed)
Pt is requesting a letter form is last visit  where the Dr told him he was dehydrated  .Marland Kitchen  And if it can be made accessible on his my chart

## 2022-06-11 NOTE — Telephone Encounter (Signed)
Called pt - no answer; left message to call the office if our assistance is still needed.

## 2023-04-30 ENCOUNTER — Ambulatory Visit
Admission: RE | Admit: 2023-04-30 | Discharge: 2023-04-30 | Disposition: A | Discharge status: Home / Self Care | Source: Ambulatory Visit

## 2023-04-30 VITALS — BP 128/83 | HR 64 | Temp 97.7°F | Resp 16

## 2023-04-30 DIAGNOSIS — J0100 Acute maxillary sinusitis, unspecified: Secondary | ICD-10-CM | POA: Diagnosis not present

## 2023-04-30 DIAGNOSIS — J309 Allergic rhinitis, unspecified: Secondary | ICD-10-CM | POA: Diagnosis not present

## 2023-04-30 MED ORDER — PREDNISONE 20 MG PO TABS: 40.0000 mg | ORAL_TABLET | Freq: Every day | ORAL | 0 refills | Status: AC

## 2023-04-30 MED ORDER — AMOXICILLIN-POT CLAVULANATE 875-125 MG PO TABS: 1.0000 | ORAL_TABLET | Freq: Two times a day (BID) | ORAL | 0 refills | Status: AC

## 2023-04-30 NOTE — ED Triage Notes (Signed)
 Pt reports intensified seasonal allergies x44month. Pt reports switching back and forth between claritin and zyrtec with no relief. Reports taking more than recommended dose some days in hopes that it would help symptoms. 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 Itchy, watery eyes are most bothersome. Has tired anti-histamine drops with no relief.

## 2023-04-30 NOTE — ED Provider Notes (Signed)
 [facility]    CSN: [id number] Arrival date & time: 04/30/23  0949      History   Chief Complaint Chief Complaint  Patient presents with   seasonal allergiers    HPI Anthony Silva is a 38 y.o. male.   Patient here today for evaluation of continued allergy symptoms he has had the last month. He has had maxillary sinus pressure. He has taken claritin and zyrtec alternately without improvement.  He does have appointment with allergist but this is not until the middle of next month.   The history is provided by the patient.    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 Authorizing Provider  amoxicillin-clavulanate (AUGMENTIN) 875-125 MG tablet Take 1 tablet by mouth every 12 (twelve) hours. 04/30/23  Yes Tomi Bamberger, PA-C  methocarbamol (ROBAXIN) 500 MG tablet Take 1,000 mg by mouth 4 (four) times daily as needed. 04/18/22  Yes [provider]  predniSONE (DELTASONE) 20 MG tablet Take 2 tablets (40 mg total) by mouth daily with breakfast for 5 days. 04/30/23 05/05/23 Yes Tomi Bamberger, PA-C  ARIPiprazole (ABILIFY) 2 MG tablet Take 2 mg by mouth every morning. Patient not taking: Reported on 04/30/2023 04/17/22   [provider]  ibuprofen (ADVIL) 600 MG tablet Take 1 tablet by mouth every 6 (six) hours as needed.    [provider]   QUEtiapine (SEROQUEL) 50 MG tablet Take 50 mg by mouth at bedtime as needed. Patient not taking: Reported on 04/30/2023 04/16/22   [provider]  sertraline (ZOLOFT) 50 MG tablet Take 50 mg by mouth every morning. Patient not taking: Reported on 04/30/2023 04/17/22   [provider]    Family History Family History  Problem Relation Age of Onset   Diabetes Father    Diabetes Brother    Stroke Maternal Grandmother     Social History Social History   Tobacco Use   Smoking status: Former    Current packs/day: 2.00    Types: Cigarettes   Smokeless tobacco: Never   Tobacco comments:    Vaping "ALL DAY"  Vaping Use   Vaping status: Every Day  Substance Use Topics   Alcohol use: Not Currently   Drug use: Yes    Types: Marijuana     Allergies   Patient has no known allergies.   Review of Systems Review of Systems  Constitutional:  Negative for chills and fever.  HENT:  Positive for congestion and postnasal drip. Negative for ear pain and sore throat.   Eyes:  Negative for discharge and redness.  Respiratory:  Positive for cough. Negative for shortness of breath.   Gastrointestinal:  Negative for abdominal pain, nausea and vomiting.     Physical Exam Triage Vital Signs ED Triage Vitals  Encounter Vitals Group  BP      Systolic BP Percentile      Diastolic BP Percentile      Pulse      Resp      Temp      Temp src      SpO2      Weight      Height      Head Circumference      Peak Flow      Pain Score      Pain Loc      Pain Education      Exclude from Growth Chart    No data found.  Updated Vital Signs BP 128/83 (BP Location: Left Arm)   Pulse 64   Temp 97.7 F (36.5 C) (Oral)   Resp 16   SpO2 97%   Visual Acuity Right Eye Distance:   Left Eye Distance:   Bilateral Distance:    Right Eye Near:   Left Eye Near:    Bilateral Near:     Physical Exam Vitals and nursing note reviewed.  Constitutional:      General: He is  not in acute distress.    Appearance: He is well-developed. He is not ill-appearing.  HENT:     Head: Normocephalic and atraumatic.     Nose: Congestion present.     Mouth/Throat:     Mouth: Mucous membranes are moist.     Pharynx: No oropharyngeal exudate or posterior oropharyngeal erythema.     Tonsils: 0 on the right. 0 on the left.  Eyes:     Conjunctiva/sclera: Conjunctivae normal.  Cardiovascular:     Rate and Rhythm: Normal rate and regular rhythm.     Heart sounds: Normal heart sounds. No murmur heard. Pulmonary:     Effort: Pulmonary effort is normal. No respiratory distress.     Breath sounds: Normal breath sounds. No wheezing, rhonchi or rales.  Skin:    General: Skin is warm and dry.  Neurological:     Mental Status: He is alert.  Psychiatric:        Mood and Affect: Mood normal.        Behavior: Behavior normal.      UC Treatments / Results  Labs (all labs ordered are listed, but only abnormal results are displayed) Labs Reviewed - No data to display  EKG   Radiology No results found.  Procedures Procedures (including critical care time)  Medications Ordered in UC Medications - No data to display  Initial Impression / Assessment and Plan / UC Course  I have reviewed the triage vital signs and the nursing notes.  Pertinent labs & imaging results that were available during my care of the patient were reviewed by me and considered in my medical decision making (see chart for details).    Will treat to cover allergic rhinitis flare as well as possible sinusitis given duration of symptoms. Steroid burst and augmentin prescribed. Advised follow up if no gradual improvement or with any further concerns.   Final Clinical Impressions(s) / UC Diagnoses   Final diagnoses:  Allergic rhinitis, unspecified seasonality, unspecified trigger  Acute non-recurrent maxillary sinusitis   Discharge Instructions   None    ED Prescriptions     Medication Sig  Dispense Auth. Provider   amoxicillin-clavulanate (AUGMENTIN) 875-125 MG tablet Take 1 tablet by mouth every 12 (twelve) hours. 14 tablet Jami Mcclintock F, PA-C   predniSONE (DELTASONE) 20 MG tablet Take 2 tablets (40 mg total) by mouth daily  with breakfast for 5 days. 10 tablet Vernestine Gondola, PA-C      PDMP not reviewed this encounter.   Vernestine Gondola, PA-C 04/30/23 1154
